# Patient Record
Sex: Male | Born: 1970 | Race: White | Hispanic: No | Smoking: Current every day smoker
Health system: Southern US, Community
[De-identification: ages and names within clinical notes are randomized; demographics above are authoritative.]

## PROBLEM LIST (undated history)

## (undated) DIAGNOSIS — F419 Anxiety disorder, unspecified: Secondary | ICD-10-CM

## (undated) HISTORY — DX: Anxiety disorder, unspecified: F41.9

---
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
## 2000-12-26 ENCOUNTER — Emergency Department (HOSPITAL_COMMUNITY): Admission: EM | Admit: 2000-12-26 | Discharge: 2000-12-26 | Payer: Self-pay | Admitting: Emergency Medicine

## 2000-12-26 ENCOUNTER — Encounter: Payer: Self-pay | Admitting: Emergency Medicine

## 2006-08-16 ENCOUNTER — Ambulatory Visit (HOSPITAL_COMMUNITY): Admission: RE | Admit: 2006-08-16 | Discharge: 2006-08-16 | Payer: Self-pay | Admitting: Internal Medicine

## 2013-01-02 ENCOUNTER — Other Ambulatory Visit: Payer: Self-pay | Admitting: Family Medicine

## 2013-01-02 NOTE — Telephone Encounter (Signed)
Ok one mo each not seen 7 mos needs ov with dr Lorin Picket before further

## 2013-02-09 ENCOUNTER — Telehealth: Payer: Self-pay | Admitting: *Deleted

## 2013-02-09 ENCOUNTER — Other Ambulatory Visit: Payer: Self-pay

## 2013-02-09 MED ORDER — SERTRALINE HCL 100 MG PO TABS: ORAL_TABLET | ORAL | Status: DC

## 2013-02-09 MED ORDER — ALPRAZOLAM 0.5 MG PO TABS: ORAL_TABLET | ORAL | Status: DC

## 2013-02-09 NOTE — Telephone Encounter (Signed)
May give 2 week on xanax and 1 refill on zoloft. He needs OV

## 2013-02-09 NOTE — Telephone Encounter (Signed)
Left message on voicemail notifying patient rx's sent into pharmacy and need to schedule OV.

## 2013-02-09 NOTE — Telephone Encounter (Signed)
Needs refill on alprazolam and sertaline today if possible. [facility] [place]. Last seen 05/26/2012.

## 2013-03-13 ENCOUNTER — Telehealth: Payer: Self-pay | Admitting: Family Medicine

## 2013-03-13 NOTE — Telephone Encounter (Signed)
Leaving Monday night  Patient is calling       [facility] in [place]

## 2013-03-13 NOTE — Telephone Encounter (Signed)
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 He says that due to his schedule he can't make appointments ahead of time and he wants to know if either he may be worked in on Monday or if more refills of his alprazolam and sertraline can be called in to [facility] in [place].

## 2013-03-13 NOTE — Telephone Encounter (Signed)
Transferred patient to front desk to schedule appointment for Monday morning per Dr. Lorin Picket.

## 2013-03-16 ENCOUNTER — Ambulatory Visit (INDEPENDENT_AMBULATORY_CARE_PROVIDER_SITE_OTHER): Payer: BC Managed Care – PPO | Admitting: Family Medicine

## 2013-03-16 ENCOUNTER — Encounter: Payer: Self-pay | Admitting: Family Medicine

## 2013-03-16 VITALS — BP 118/84 | Ht 71.0 in | Wt 269.2 lb

## 2013-03-16 DIAGNOSIS — E785 Hyperlipidemia, unspecified: Secondary | ICD-10-CM

## 2013-03-16 DIAGNOSIS — R358 Other polyuria: Secondary | ICD-10-CM

## 2013-03-16 DIAGNOSIS — F17200 Nicotine dependence, unspecified, uncomplicated: Secondary | ICD-10-CM

## 2013-03-16 DIAGNOSIS — F411 Generalized anxiety disorder: Secondary | ICD-10-CM

## 2013-03-16 DIAGNOSIS — R3589 Other polyuria: Secondary | ICD-10-CM

## 2013-03-16 DIAGNOSIS — Z720 Tobacco use: Secondary | ICD-10-CM | POA: Insufficient documentation

## 2013-03-16 DIAGNOSIS — F419 Anxiety disorder, unspecified: Secondary | ICD-10-CM | POA: Insufficient documentation

## 2013-03-16 MED ORDER — SERTRALINE HCL 100 MG PO TABS: ORAL_TABLET | ORAL | Status: DC

## 2013-03-16 MED ORDER — ALPRAZOLAM 0.5 MG PO TABS: ORAL_TABLET | ORAL | Status: DC

## 2013-03-16 NOTE — Patient Instructions (Signed)
DASH Diet  The DASH diet stands for "Dietary Approaches to Stop Hypertension." It is a healthy eating plan that has been shown to reduce high blood pressure (hypertension) in as little as 14 days, while also possibly providing other significant health benefits. These other health benefits include reducing the risk of breast cancer after menopause and reducing the risk of type 2 diabetes, heart disease, colon cancer, and stroke. Health benefits also include weight loss and slowing kidney failure in patients with chronic kidney disease.   DIET GUIDELINES  · Limit salt (sodium). Your diet should contain less than 1500 mg of sodium daily.  · Limit refined or processed carbohydrates. Your diet should include mostly whole grains. Desserts and added sugars should be used sparingly.  · Include small amounts of heart-healthy fats. These types of fats include nuts, oils, and tub margarine. Limit saturated and trans fats. These fats have been shown to be harmful in the body.  CHOOSING FOODS   The following food groups are based on a 2000 calorie diet. See your Registered Dietitian for individual calorie needs.  Grains and Grain Products (6 to 8 servings daily)  · Eat More Often: Whole-wheat bread, brown rice, whole-grain or wheat pasta, quinoa, popcorn without added fat or salt (air popped).  · Eat Less Often: White bread, white pasta, white rice, cornbread.  Vegetables (4 to 5 servings daily)  · Eat More Often: Fresh, frozen, and canned vegetables. Vegetables may be raw, steamed, roasted, or grilled with a minimal amount of fat.  · Eat Less Often/Avoid: Creamed or fried vegetables. Vegetables in a cheese sauce.  Fruit (4 to 5 servings daily)  · Eat More Often: All fresh, canned (in natural juice), or frozen fruits. Dried fruits without added sugar. One hundred percent fruit juice (½ cup [237 mL] daily).  · Eat Less Often: Dried fruits with added sugar. Canned fruit in light or heavy syrup.  Lean Meats, Fish, and Poultry (2  servings or less daily. One serving is 3 to 4 oz [85-114 g]).  · Eat More Often: Ninety percent or leaner ground beef, tenderloin, sirloin. Round cuts of beef, chicken breast, turkey breast. All fish. Grill, bake, or broil your meat. Nothing should be fried.  · Eat Less Often/Avoid: Fatty cuts of meat, turkey, or chicken leg, thigh, or wing. Fried cuts of meat or fish.  Dairy (2 to 3 servings)  · Eat More Often: Low-fat or fat-free milk, low-fat plain or light yogurt, reduced-fat or part-skim cheese.  · Eat Less Often/Avoid: Milk (whole, 2%). Whole milk yogurt. Full-fat cheeses.  Nuts, Seeds, and Legumes (4 to 5 servings per week)  · Eat More Often: All without added salt.  · Eat Less Often/Avoid: Salted nuts and seeds, canned beans with added salt.  Fats and Sweets (limited)  · Eat More Often: Vegetable oils, tub margarines without trans fats, sugar-free gelatin. Mayonnaise and salad dressings.  · Eat Less Often/Avoid: Coconut oils, palm oils, butter, stick margarine, cream, half and half, cookies, candy, pie.  FOR MORE INFORMATION  The Dash Diet Eating Plan: www.dashdiet.org  Document Released: 06/28/2011 Document Revised: 10/01/2011 Document Reviewed: 06/28/2011  ExitCare® Patient Information ©2014 [employer].

## 2013-03-16 NOTE — Progress Notes (Signed)
  Subjective:    Patient ID: Jordan Jones, male    DOB: July 08, 1971, 42 y.o.   MRN: [id number]  HPI Here for refills on meds, Zoloft and Xanax. Both are working for him. Patient uses Xanax sparingly he only uses it when he is not driving he denies abusing the medication.Marland Kitchen He does relate he does a very good job of taking his Zoloft and helps him greatly he denies any other particular problems no chest tightness pressure pain shortness of breath. He does smoke he knows he needs to quit smoking we also talked about healthy diet and regular exercise and try to bring his weight down. No other concerns.     Review of Systems See above.    Objective:   Physical Exam Lungs are clear hearts regular pulse normal extremities no edema skin warm dry neurologic grossly normal       Assessment & Plan:  Chronic anxiety issues-I. recommend that he continue onward with what he is doing Xanax sparingly.  This patient was counseled to quit smoking, maintain a healthy diet, and increase activity/exercise. Lab work was ordered. Followup again in approximately 6 months

## 2013-09-15 ENCOUNTER — Other Ambulatory Visit: Payer: Self-pay | Admitting: Family Medicine

## 2013-09-15 NOTE — Telephone Encounter (Signed)
May refill x2 needs office visit later this spring

## 2013-09-15 NOTE — Telephone Encounter (Signed)
Last seen 03/16/13

## 2013-11-28 ENCOUNTER — Other Ambulatory Visit: Payer: Self-pay | Admitting: Family Medicine

## 2013-11-30 NOTE — Telephone Encounter (Signed)
May have prescription for 14 tablets needs office visit

## 2013-11-30 NOTE — Telephone Encounter (Signed)
Last seen 03/11/13.

## 2014-03-28 ENCOUNTER — Other Ambulatory Visit: Payer: Self-pay | Admitting: Family Medicine

## 2014-04-29 ENCOUNTER — Other Ambulatory Visit: Payer: Self-pay | Admitting: *Deleted

## 2014-04-29 ENCOUNTER — Telehealth: Payer: Self-pay | Admitting: Family Medicine

## 2014-04-29 MED ORDER — SERTRALINE HCL 100 MG PO TABS: ORAL_TABLET | ORAL | Status: DC

## 2014-04-29 NOTE — Telephone Encounter (Signed)
Patient is requesting Rx for sertraline (ZOLOFT) 100 MG tablet. 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 He said he will make a follow up visit when he is available.     [facility]

## 2014-04-29 NOTE — Telephone Encounter (Signed)
Last seen over 1 year ago 02/2013

## 2014-04-29 NOTE — Telephone Encounter (Signed)
Med sent to pharm. Pt notified on voicemail that med was sent and that he needs ov before any further refills.

## 2014-04-29 NOTE — Telephone Encounter (Signed)
May have 30 with 1 refill, this gives him plenty of time to schedule a follow up with us

## 2014-07-02 ENCOUNTER — Ambulatory Visit (INDEPENDENT_AMBULATORY_CARE_PROVIDER_SITE_OTHER): Payer: BC Managed Care – PPO | Admitting: Nurse Practitioner

## 2014-07-02 ENCOUNTER — Encounter: Payer: Self-pay | Admitting: Nurse Practitioner

## 2014-07-02 VITALS — BP 132/88 | Ht 71.0 in | Wt 259.0 lb

## 2014-07-02 DIAGNOSIS — F419 Anxiety disorder, unspecified: Secondary | ICD-10-CM

## 2014-07-02 MED ORDER — ALPRAZOLAM 0.5 MG PO TABS: 0.5000 mg | ORAL_TABLET | Freq: Every evening | ORAL | Status: DC | PRN

## 2014-07-02 MED ORDER — SERTRALINE HCL 100 MG PO TABS: ORAL_TABLET | ORAL | Status: DC

## 2014-07-04 ENCOUNTER — Encounter: Payer: Self-pay | Admitting: Nurse Practitioner

## 2014-07-04 NOTE — Progress Notes (Signed)
Subjective:  Presents for recheck on anxiety. Doing well on Zoloft. Rare xanax for sleep. Has not had a physical or labs in a long time.   Objective:   BP 132/88 mmHg  Ht 5\' 11"  (1.803 m)  Wt 259 lb (117.482 kg)  BMI 36.14 kg/m2 NAD. Alert, oriented. Calm affect. Lungs clear. Heart RRR.  Assessment:  Problem List Items Addressed This Visit      Other   Chronic anxiety - Primary   Relevant Medications      ALPRAZolam (XANAX) tablet      sertraline (ZOLOFT) tablet      Plan:  Meds ordered this encounter  Medications  . ALPRAZolam (XANAX) 0.5 MG tablet    Sig: Take 1 tablet (0.5 mg total) by mouth at bedtime as needed.    Dispense:  14 tablet    Refill:  0    Order Specific Question:  Supervising Provider    Answer:  Merlyn AlbertLUKING, WILLIAM S [[id number]]  . sertraline (ZOLOFT) 100 MG tablet    Sig: TAKE 1 TABLET BY MOUTH EVERY DAY    Dispense:  90 tablet    Refill:  3    Order Specific Question:  Supervising Provider    Answer:  Riccardo DubinLUKING, WILLIAM S [[id number]]   Strongly recommend PE and screening labs. Patient will consider this and call back. Return in about 1 year (around 07/03/2015).

## 2015-07-21 ENCOUNTER — Other Ambulatory Visit: Payer: Self-pay | Admitting: Nurse Practitioner

## 2015-07-22 NOTE — Telephone Encounter (Signed)
This patient needs office visit, may give 2 weeks of medicine

## 2015-08-05 ENCOUNTER — Telehealth: Payer: Self-pay | Admitting: Family Medicine

## 2015-08-05 MED ORDER — SERTRALINE HCL 100 MG PO TABS: 100.0000 mg | ORAL_TABLET | Freq: Every day | ORAL | Status: DC

## 2015-08-05 NOTE — Telephone Encounter (Signed)
Give 2 weeease

## 2015-08-05 NOTE — Telephone Encounter (Signed)
Called patient and informed him per Dr.Scott Luking- 2 weeks worth of medication was sent into pharmacy. Patient verbalized understanding.

## 2015-08-05 NOTE — Telephone Encounter (Signed)
Patient would like refill on zoloft 100 mg just enough until he is seen on 1/23 for medcheck.

## 2015-08-15 ENCOUNTER — Encounter: Payer: Self-pay | Admitting: Family Medicine

## 2015-08-15 ENCOUNTER — Ambulatory Visit (INDEPENDENT_AMBULATORY_CARE_PROVIDER_SITE_OTHER): Payer: BLUE CROSS/BLUE SHIELD | Admitting: Family Medicine

## 2015-08-15 VITALS — BP 136/86 | Ht 71.0 in | Wt 249.8 lb

## 2015-08-15 DIAGNOSIS — Z131 Encounter for screening for diabetes mellitus: Secondary | ICD-10-CM

## 2015-08-15 DIAGNOSIS — F419 Anxiety disorder, unspecified: Secondary | ICD-10-CM | POA: Diagnosis not present

## 2015-08-15 DIAGNOSIS — E785 Hyperlipidemia, unspecified: Secondary | ICD-10-CM

## 2015-08-15 DIAGNOSIS — Z720 Tobacco use: Secondary | ICD-10-CM | POA: Diagnosis not present

## 2015-08-15 DIAGNOSIS — E669 Obesity, unspecified: Secondary | ICD-10-CM

## 2015-08-15 MED ORDER — SERTRALINE HCL 100 MG PO TABS: 100.0000 mg | ORAL_TABLET | Freq: Every day | ORAL | Status: DC

## 2015-08-15 NOTE — Progress Notes (Signed)
   Subjective:    Patient ID: Jordan Jones, male    DOB: [birth year], 45 y.o.   MRN: [id number]  HPI Patient arrives for a follow up on anxiety. Patient doing well on Zoloft with no problems or concerns. Patient does state he smokes a 1 pack a day slightly more denies chest tightness pressure pain difficulty breathing swelling in the legs denies being depressed. Staying Zoloft as well for anxiousness. Does not take Xanax currently. Tries to eat healthy unable to do exercise because of his work.  Review of Systems See above    Objective:   Physical Exam  Mild obesity lungs clear heart regular pulse normal extremities no edema      Assessment & Plan:  Chronic generalized anxiety. Not depressed. Zoloft does well for him. Continue current measures   smoking-patient was counseled and he was counseled move forward to quitting. Follow-up if ongoing troubles  Hyperlipidemia check lipid profile also needs metabolic 7 these were ordered he will get this in the next month

## 2015-08-15 NOTE — Patient Instructions (Signed)

## 2016-05-05 ENCOUNTER — Other Ambulatory Visit: Payer: Self-pay | Admitting: Family Medicine

## 2016-05-07 NOTE — Telephone Encounter (Signed)
May have this and 2 refills needs office visit 

## 2017-01-25 ENCOUNTER — Other Ambulatory Visit: Payer: Self-pay | Admitting: Family Medicine

## 2017-01-28 NOTE — Telephone Encounter (Signed)
Last visit 1-1/2 years ago, needs office visit refuse medication

## 2017-01-28 NOTE — Telephone Encounter (Signed)
Last seen 08/18/2016

## 2017-01-28 NOTE — Telephone Encounter (Signed)
scotts pt 

## 2018-11-12 ENCOUNTER — Other Ambulatory Visit: Payer: Self-pay

## 2018-11-12 ENCOUNTER — Other Ambulatory Visit: Payer: Self-pay | Admitting: Family Medicine

## 2018-11-12 ENCOUNTER — Ambulatory Visit (INDEPENDENT_AMBULATORY_CARE_PROVIDER_SITE_OTHER): Payer: BLUE CROSS/BLUE SHIELD | Admitting: Family Medicine

## 2018-11-12 DIAGNOSIS — I889 Nonspecific lymphadenitis, unspecified: Secondary | ICD-10-CM

## 2018-11-12 DIAGNOSIS — B349 Viral infection, unspecified: Secondary | ICD-10-CM | POA: Diagnosis not present

## 2018-11-12 MED ORDER — DOXYCYCLINE HYCLATE 100 MG PO TABS: 100.0000 mg | ORAL_TABLET | Freq: Two times a day (BID) | ORAL | 0 refills | Status: DC

## 2018-11-12 NOTE — Progress Notes (Signed)
   Subjective:    Patient ID: Jordan Jones, male    DOB: [birth year], 48 y.o.   MRN: [id number] audio plus visual Sinus Problem  This is a new problem. The current episode started yesterday. Associated symptoms include congestion and a sore throat. (Lymph node sore and swollen Muscle aches Fatigue) Past treatments include nothing.   Patient needs doctor note to return to work   Review of Systems  HENT: Positive for congestion and sore throat.    Virtual Visit via Video Note  I connected with Jordan Jones on 11/12/18 at  3:30 PM EDT by a video enabled telemedicine application and verified that I am speaking with the correct person using two identifiers.   I discussed the limitations of evaluation and management by telemedicine and the availability of in person appointments. The patient expressed understanding and agreed to proceed.  History of Present Illness:    Observations/Objective:   Assessment and Plan:   Follow Up Instructions:    I discussed the assessment and treatment plan with the patient. The patient was provided an opportunity to ask questions and all were answered. The patient agreed with the plan and demonstrated an understanding of the instructions.   The patient was advised to call back or seek an in-person evaluation if the symptoms worsen or if the condition fails to improve as anticipated.  I provided 15 minutes of non-face-to-face time during this encounter.  On Monday patient developed some sore throat.  Also mild headache.  Some nasal congestion.  Also muscle aches.  Next day noted some low-grade fever.  Energy level not 100% no nausea no vomiting no diarrhea decent appetite  Also notes very tender left anterior cervical lymph node     Objective:   Physical Exam   Virtual visit     Assessment & Plan:  Impression viral syndrome.  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  Homero Fellers  discussion held.  Will cover with antibiotics for cervical lymphadenitis, however patient has potential to have early coronavirus infection.  Discussed.  Warning signs discussed.  Will write work excuse.  Rationale discussed.

## 2018-11-14 NOTE — Patient Instructions (Addendum)
                 11/13/2018 To whom it concerns,  I am dictating this letter in regards to Jordan Jones, date of birth Feb 25, 1971, a longstanding patient of our practice.  Patient has had a viral syndrome developed as a Monday, April 20.  Since widespread testing is not available, we have to assume the possibility that this could be a coronavirus infection, though it likely is not.  According to [facility] recommendations, the following must occurred before the patient can return to work.  1.  The patient asked to wait 7 days until potential return to work April 27.  2.  The patient has to be fever free for the 3 days prior to the 27th, without taking any medicine they can lower her temperature like Tylenol or Motrin.  3.  Patient has to report substantial improvement in symptoms.  If patient fits all this criteria may return to work as of April 27.  Sincerely, Lubertha South MD

## 2020-02-22 DIAGNOSIS — Z713 Dietary counseling and surveillance: Secondary | ICD-10-CM | POA: Diagnosis not present

## 2020-06-11 ENCOUNTER — Other Ambulatory Visit: Payer: Self-pay

## 2020-06-11 ENCOUNTER — Ambulatory Visit
Admission: EM | Admit: 2020-06-11 | Discharge: 2020-06-11 | Disposition: A | Discharge status: Home / Self Care | Payer: BC Managed Care – PPO | Attending: Emergency Medicine | Admitting: Emergency Medicine

## 2020-06-11 ENCOUNTER — Encounter: Payer: Self-pay | Admitting: Emergency Medicine

## 2020-06-11 DIAGNOSIS — S61411A Laceration without foreign body of right hand, initial encounter: Secondary | ICD-10-CM | POA: Diagnosis not present

## 2020-06-11 DIAGNOSIS — Z23 Encounter for immunization: Secondary | ICD-10-CM | POA: Diagnosis not present

## 2020-06-11 MED ORDER — TETANUS-DIPHTH-ACELL PERTUSSIS 5-2.5-18.5 LF-MCG/0.5 IM SUSY
0.5000 mL | PREFILLED_SYRINGE | Freq: Once | INTRAMUSCULAR | Status: AC
  Administered 2020-06-11: 0.5 mL via INTRAMUSCULAR

## 2020-06-11 MED ORDER — CEPHALEXIN 500 MG PO CAPS: 500.0000 mg | ORAL_CAPSULE | Freq: Four times a day (QID) | ORAL | 0 refills | Status: DC

## 2020-06-11 NOTE — ED Provider Notes (Addendum)
[facility]   [id number] 06/11/20 Arrival Time: 1438  CC: LACERATION  SUBJECTIVE:  Jordan Jones is a 49 y.o. male Who presented to the urgent care with a complaint of laceration to right palm that occurred today.  Developed symptom from a laceration from a knife.  Currently not on blood thinners.  Denies any symptom in the past.  Denies chills, fever, nausea, vomiting, redness, swelling, purulent drainage, decreased strength sensation.  Td UTD: No.  ROS: As per HPI.  All other pertinent ROS negative.     Past Medical History:  Diagnosis Date  . Anxiety    Past Surgical History:  Procedure Laterality Date  . FINGER SURGERY  1990   severed finger left pinky    No Known Allergies No current facility-administered medications on file prior to encounter.   Current Outpatient Medications on File Prior to Encounter  Medication Sig Dispense Refill  . ALPRAZolam (XANAX) 0.5 MG tablet Take 1 tablet (0.5 mg total) by mouth at bedtime as needed. 14 tablet 0  . doxycycline (VIBRA-TABS) 100 MG tablet Take 1 tablet (100 mg total) by mouth 2 (two) times daily. With tall glass of water 20 tablet 0  . sertraline (ZOLOFT) 100 MG tablet TAKE 1 TABLET BY MOUTH DAILY 30 tablet 2   Social History   Socioeconomic History  . Marital status: Single    Spouse name: Not on file  . Number of children: Not on file  . Years of education: Not on file  . Highest education level: Not on file  Occupational History  . Not on file  Tobacco Use  . Smoking status: Current Every Day Smoker    Packs/day: 1.00    Types: Cigarettes  . Smokeless tobacco: Never Used  Substance and Sexual Activity  . Alcohol use: Never  . Drug use: Never  . Sexual activity: Not on file  Other Topics Concern  . Not on file  Social History Narrative  . Not on file   Social Determinants of Health   Financial Resource Strain:   . Difficulty of Paying Living Expenses: Not on file  Food Insecurity:   .  Worried About Programme researcher, broadcasting/film/video in the Last Year: Not on file  . Ran Out of Food in the Last Year: Not on file  Transportation Needs:   . Lack of Transportation (Medical): Not on file  . Lack of Transportation (Non-Medical): Not on file  Physical Activity:   . Days of Exercise per Week: Not on file  . Minutes of Exercise per Session: Not on file  Stress:   . Feeling of Stress : Not on file  Social Connections:   . Frequency of Communication with Friends and Family: Not on file  . Frequency of Social Gatherings with Friends and Family: Not on file  . Attends Religious Services: Not on file  . Active Member of Clubs or Organizations: Not on file  . Attends Banker Meetings: Not on file  . Marital Status: Not on file  Intimate Partner Violence:   . Fear of Current or Ex-Partner: Not on file  . Emotionally Abused: Not on file  . Physically Abused: Not on file  . Sexually Abused: Not on file   Family History  Problem Relation Age of Onset  . Heart attack Other      OBJECTIVE:  Vitals:   06/11/20 1545 06/11/20 1546  BP: 129/89   Pulse: 96   Resp: 17   Temp: 98.6 F (  37 C)   TempSrc: Oral   SpO2: 96%   Weight:  250 lb (113.4 kg)  Height:  5\' 11"  (1.803 m)     General appearance: alert; no distress Chest: CTA, heart sounds normal Heart: RRR, no rub, gallop or murmur Skin: laceration of right palm; size: approx 2 cm Psychological: alert and cooperative; normal mood and affect   No results found for this or any previous visit.  Labs Reviewed - No data to display  No results found.  Procedure: Verbal consent obtained. Patient provided with risks and alternatives to the procedure. Wound copiously irrigated with NS then cleansed with betadine. Anesthetized with 2 mL of lidocaine with epinephrine after LET. Wound carefully explored. No foreign body, tendon injury, or nonviable tissue were noted. Using sterile technique 2 interrupted 5-0 Ethilon Prolene  sutures were placed to reapproximate the wound. Patient tolerated procedure well. No complications. Minimal bleeding. Patient advised to look for and return for any signs of infection such as redness, swelling, discharge, or worsening pain. Return for suture removal in 7 days.  ASSESSMENT & PLAN:  1. Laceration of right hand without foreign body, initial encounter     Meds ordered this encounter  Medications  . Tdap (BOOSTRIX) injection 0.5 mL  . cephALEXin (KEFLEX) 500 MG capsule    Sig: Take 1 capsule (500 mg total) by mouth 4 (four) times daily.    Dispense:  20 capsule    Refill:  0   Discharge instructions Bandage applied Keep covered for next and dry for next 24-48 hours.  After then you may gently clean with warm water and mild soap.  Avoid submerging wound in water. Change dressing daily and apply a thin layer of neosporin.  Return in 7-10 days to have sutures removed.   Take OTC ibuprofen or tylenol as needed for pain releif Return sooner or go to the ED if you have any new or worsening symptoms such as increased pain, redness, swelling, drainage, discharge, decreased range of motion of extremity, etc..     Reviewed expectations re: course of current medical issues. Questions answered. Outlined signs and symptoms indicating need for more acute intervention. Patient verbalized understanding. After Visit Summary given.   9-10, FNP 06/11/20 1630    06/13/20, FNP 06/11/20 1630

## 2020-06-11 NOTE — ED Triage Notes (Signed)
Laceration to RT palm from knife that happened today. No active bleeding. Does not recall last tetanus vaccine.

## 2020-06-11 NOTE — Discharge Instructions (Addendum)
Bandage applied Keep covered for next and dry for next 24-48 hours.  After then you may gently clean with warm water and mild soap.  Avoid submerging wound in water. Change dressing daily and apply a thin layer of neosporin.  Return in 7-10 days to have sutures removed.   Take OTC ibuprofen or tylenol as needed for pain releif Return sooner or go to the ED if you have any new or worsening symptoms such as increased pain, redness, swelling, drainage, discharge, decreased range of motion of extremity, etc..   

## 2020-06-14 DIAGNOSIS — Z713 Dietary counseling and surveillance: Secondary | ICD-10-CM | POA: Diagnosis not present

## 2020-09-26 DIAGNOSIS — Z713 Dietary counseling and surveillance: Secondary | ICD-10-CM | POA: Diagnosis not present

## 2021-02-28 ENCOUNTER — Ambulatory Visit
Admission: RE | Admit: 2021-02-28 | Discharge: 2021-02-28 | Disposition: A | Discharge status: Home / Self Care | Payer: Worker's Compensation | Source: Ambulatory Visit | Attending: Physician Assistant | Admitting: Physician Assistant

## 2021-02-28 ENCOUNTER — Ambulatory Visit
Admission: RE | Admit: 2021-02-28 | Discharge: 2021-02-28 | Disposition: A | Discharge status: Home / Self Care | Payer: Worker's Compensation | Attending: Physician Assistant | Admitting: Physician Assistant

## 2021-02-28 ENCOUNTER — Other Ambulatory Visit: Payer: Self-pay | Admitting: Physician Assistant

## 2021-02-28 DIAGNOSIS — R52 Pain, unspecified: Secondary | ICD-10-CM
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
## 2023-01-05 ENCOUNTER — Other Ambulatory Visit: Payer: Self-pay

## 2023-01-05 ENCOUNTER — Ambulatory Visit
Admission: EM | Admit: 2023-01-05 | Discharge: 2023-01-05 | Disposition: A | Discharge status: Home / Self Care | Payer: No Typology Code available for payment source | Attending: Family Medicine | Admitting: Family Medicine

## 2023-01-05 ENCOUNTER — Encounter: Payer: Self-pay | Admitting: Emergency Medicine

## 2023-01-05 DIAGNOSIS — A630 Anogenital (venereal) warts: Secondary | ICD-10-CM

## 2023-01-05 NOTE — ED Triage Notes (Addendum)
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 Pt also reports intermittent "bloody mucous-like" drainage from rectum.   Pt reports reached out to pcp this week but reports was dropped as a patient.

## 2023-01-07 NOTE — ED Provider Notes (Signed)
  [facility]   [id number] 01/05/23 Arrival Time: 0825  ASSESSMENT & PLAN:  1. Condyloma acuminatum of anus    Recommend surgical evaluation. Referral placed. OTC analgesics if necessary.   Follow-up Information     Call  [facility].   Specialty: General Surgery Contact information: [address] [place] [address] [phone]               Reviewed expectations re: course of current medical issues. Questions answered. Outlined signs and symptoms indicating need for more acute intervention. Patient verbalized understanding. After Visit Summary given.   SUBJECTIVE: History from: patient. Jordan Jones is a 52 y.o. 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 Reports a recent flare-up "of internal and external bumps" on rectum;  past few months. Pt also reports intermittent "bloody mucous-like" drainage from rectum.   Pt reports reached out to pcp this week but reports was dropped as a patient. Past Surgical History:  Procedure Laterality Date   FINGER SURGERY  1990   severed finger left pinky      OBJECTIVE:  Vitals:   01/05/23 0905  BP: (!) 137/90  Pulse: 61  Resp: 20  Temp: 98.1 F (36.7 C)  TempSrc: Oral  SpO2: 97%    Rectal: extensive burden of anal warts around rectum extending 1-2 cm from rectum; no bleeding; no firm masses Psychological: alert and cooperative; normal mood and affect  No Known Allergies                                             Past Medical History:  Diagnosis Date   Anxiety     Social History   Socioeconomic History   Marital status: Single    Spouse name: Not on file   Number of children: Not on file   Years of education: Not on file   Highest education level: Not on file  Occupational History   Not on file  Tobacco Use   Smoking status: Every Day    Packs/day: 1    Types:  Cigarettes   Smokeless tobacco: Never  Substance and Sexual Activity   Alcohol use: Never   Drug use: Never   Sexual activity: Not on file  Other Topics Concern   Not on file  Social History Narrative   Not on file   Social Determinants of Health   Financial Resource Strain: Not on file  Food Insecurity: Not on file  Transportation Needs: Not on file  Physical Activity: Not on file  Stress: Not on file  Social Connections: Not on file  Intimate Partner Violence: Not on file    Family History  Problem Relation Age of Onset   Heart attack Other      Mardella Layman, MD 01/07/23 0900

## 2023-01-22 ENCOUNTER — Other Ambulatory Visit: Payer: Self-pay

## 2023-01-22 ENCOUNTER — Encounter: Payer: Self-pay | Admitting: General Surgery

## 2023-01-22 ENCOUNTER — Ambulatory Visit (INDEPENDENT_AMBULATORY_CARE_PROVIDER_SITE_OTHER): Payer: No Typology Code available for payment source | Admitting: General Surgery

## 2023-01-22 VITALS — BP 138/83 | HR 58 | Temp 97.8°F | Resp 16 | Ht 71.0 in | Wt 186.0 lb

## 2023-01-22 DIAGNOSIS — A630 Anogenital (venereal) warts: Secondary | ICD-10-CM

## 2023-01-22 NOTE — H&P (Signed)
Jordan Jones; [id number]; May 04, 1971   HPI Patient is a 52 year old white male who was referred to my care by the emergency room for evaluation and treatment of recurrent condyloma.  Patient has had previous condyloma removed from his penis but now has extensive condyloma of the anus.  He is having a difficult time keeping himself clean. Past Medical History:  Diagnosis Date   Anxiety     Past Surgical History:  Procedure Laterality Date   FINGER SURGERY  1990   severed finger left pinky     Family History  Problem Relation Age of Onset   Heart attack Other     No current outpatient medications on file prior to visit.   No current facility-administered medications on file prior to visit.    No Known Allergies  Social History   Substance and Sexual Activity  Alcohol Use Never    Social History   Tobacco Use  Smoking Status Every Day   Packs/day: 1.5   Types: Cigarettes  Smokeless Tobacco Never    Review of Systems  Constitutional: Negative.   HENT: Negative.    Eyes: Negative.   Respiratory: Negative.    Cardiovascular: Negative.   Gastrointestinal:  Positive for abdominal pain.  Genitourinary:  Positive for frequency.  Musculoskeletal:  Positive for joint pain.  Skin: Negative.   Neurological: Negative.   Endo/Heme/Allergies: Negative.   Psychiatric/Behavioral: Negative.      Objective   Vitals:   01/22/23 1009  BP: 138/83  Pulse: (!) 58  Resp: 16  Temp: 97.8 F (36.6 C)  SpO2: 98%    Physical Exam Vitals reviewed.  Constitutional:      Appearance: Normal appearance. He is normal weight. He is not ill-appearing.  HENT:     Head: Normocephalic and atraumatic.  Cardiovascular:     Rate and Rhythm: Normal rate and regular rhythm.     Heart sounds: Normal heart sounds. No murmur heard.    No friction rub. No gallop.  Pulmonary:     Effort: Pulmonary effort is normal. No respiratory distress.     Breath sounds: Normal breath sounds. No  stridor. No wheezing, rhonchi or rales.  Genitourinary:    Comments: Extensive condyloma noted around the anus.  Examination of the anal canal is limited secondary to the extensiveness of the condyloma. Skin:    General: Skin is warm and dry.  Neurological:     Mental Status: He is alert and oriented to person, place, and time.     Assessment  Condyloma acuminatum of anus, extensive Plan  Patient is scheduled for excision of the condyloma of the anus on 02/06/2023.  The risks and benefits of the procedure including bleeding, infection, pain, the possibility of recurrence of the condyloma were fully explained to the patient, who gave informed consent.  I told him that I would probably not be able to fully excise all the condyloma at this sitting.  He understands.

## 2023-01-22 NOTE — Progress Notes (Signed)
Jordan Jones; [id number]; 09/14/1970   HPI Patient is a 51-year-old white male who was referred to my care by the emergency room for evaluation and treatment of recurrent condyloma.  Patient has had previous condyloma removed from his penis but now has extensive condyloma of the anus.  He is having a difficult time keeping himself clean. Past Medical History:  Diagnosis Date   Anxiety     Past Surgical History:  Procedure Laterality Date   FINGER SURGERY  1990   severed finger left pinky     Family History  Problem Relation Age of Onset   Heart attack Other     No current outpatient medications on file prior to visit.   No current facility-administered medications on file prior to visit.    No Known Allergies  Social History   Substance and Sexual Activity  Alcohol Use Never    Social History   Tobacco Use  Smoking Status Every Day   Packs/day: 1.5   Types: Cigarettes  Smokeless Tobacco Never    Review of Systems  Constitutional: Negative.   HENT: Negative.    Eyes: Negative.   Respiratory: Negative.    Cardiovascular: Negative.   Gastrointestinal:  Positive for abdominal pain.  Genitourinary:  Positive for frequency.  Musculoskeletal:  Positive for joint pain.  Skin: Negative.   Neurological: Negative.   Endo/Heme/Allergies: Negative.   Psychiatric/Behavioral: Negative.      Objective   Vitals:   01/22/23 1009  BP: 138/83  Pulse: (!) 58  Resp: 16  Temp: 97.8 F (36.6 C)  SpO2: 98%    Physical Exam Vitals reviewed.  Constitutional:      Appearance: Normal appearance. He is normal weight. He is not ill-appearing.  HENT:     Head: Normocephalic and atraumatic.  Cardiovascular:     Rate and Rhythm: Normal rate and regular rhythm.     Heart sounds: Normal heart sounds. No murmur heard.    No friction rub. No gallop.  Pulmonary:     Effort: Pulmonary effort is normal. No respiratory distress.     Breath sounds: Normal breath sounds. No  stridor. No wheezing, rhonchi or rales.  Genitourinary:    Comments: Extensive condyloma noted around the anus.  Examination of the anal canal is limited secondary to the extensiveness of the condyloma. Skin:    General: Skin is warm and dry.  Neurological:     Mental Status: He is alert and oriented to person, place, and time.     Assessment  Condyloma acuminatum of anus, extensive Plan  Patient is scheduled for excision of the condyloma of the anus on 02/06/2023.  The risks and benefits of the procedure including bleeding, infection, pain, the possibility of recurrence of the condyloma were fully explained to the patient, who gave informed consent.  I told him that I would probably not be able to fully excise all the condyloma at this sitting.  He understands. 

## 2023-02-01 NOTE — Patient Instructions (Signed)
Jordan Jones  02/01/2023     @PREFPERIOPPHARMACY @   Your procedure is scheduled on  02/06/2023.   Report to [facility] at  [phone]  A.M.   Call this number if you have problems the morning of surgery:  [phone]  If you experience any cold or flu symptoms such as cough, fever, chills, shortness of breath, etc. between now and your scheduled surgery, please notify us at the above number.   Remember:  Do not eat or drink after midnight.      Take these medicines the morning of surgery with A SIP OF WATER                                                None.    Do not wear jewelry, make-up or nail polish, including gel polish,  artificial nails, or any other type of covering on natural nails (fingers and  toes).  Do not wear lotions, powders, or perfumes, or deodorant.  Do not shave 48 hours prior to surgery.  Men may shave face and neck.  Do not bring valuables to the hospital.  [facility] is not responsible for any belongings or valuables.  Contacts, dentures or bridgework may not be worn into surgery.  Leave your suitcase in the car.  After surgery it may be brought to your room.  For patients admitted to the hospital, discharge time will be determined by your treatment team.  Patients discharged the day of surgery will not be allowed to drive home and must have someone with them for 24 hours.    Special instructions:   DO NOT smoke tobacco or vape for 24 hours before your procedure.  Please read over the following fact sheets that you were given. Coughing and Deep Breathing, Anesthesia Post-op Instructions, and Care and Recovery After Surgery      General Anesthesia, Adult, Care After The following information offers guidance on how to care for yourself after your procedure. Your health care provider may also give you more specific instructions. If you have problems or questions, contact your health care provider. What can I expect after the procedure? After  the procedure, it is common for people to: Have pain or discomfort at the IV site. Have nausea or vomiting. Have a sore throat or hoarseness. Have trouble concentrating. Feel cold or chills. Feel weak, sleepy, or tired (fatigue). Have soreness and body aches. These can affect parts of the body that were not involved in surgery. Follow these instructions at home: For the time period you were told by your health care provider:  Rest. Do not participate in activities where you could fall or become injured. Do not drive or use machinery. Do not drink alcohol. Do not take sleeping pills or medicines that cause drowsiness. Do not make important decisions or sign legal documents. Do not take care of children on your own. General instructions Drink enough fluid to keep your urine pale yellow. If you have sleep apnea, surgery and certain medicines can increase your risk for breathing problems. Follow instructions from your health care provider about wearing your sleep device: Anytime you are sleeping, including during daytime naps. While taking prescription pain medicines, sleeping medicines, or medicines that make you drowsy. Return to your normal activities as told by your health care provider. Ask your health care provider  what activities are safe for you. Take over-the-counter and prescription medicines only as told by your health care provider. Do not use any products that contain nicotine or tobacco. These products include cigarettes, chewing tobacco, and vaping devices, such as e-cigarettes. These can delay incision healing after surgery. If you need help quitting, ask your health care provider. Contact a health care provider if: You have nausea or vomiting that does not get better with medicine. You vomit every time you eat or drink. You have pain that does not get better with medicine. You cannot urinate or have bloody urine. You develop a skin rash. You have a fever. Get help right  away if: You have trouble breathing. You have chest pain. You vomit blood. These symptoms may be an emergency. Get help right away. Call 911. Do not wait to see if the symptoms will go away. Do not drive yourself to the hospital. Summary After the procedure, it is common to have a sore throat, hoarseness, nausea, vomiting, or to feel weak, sleepy, or fatigue. For the time period you were told by your health care provider, do not drive or use machinery. Get help right away if you have difficulty breathing, have chest pain, or vomit blood. These symptoms may be an emergency. This information is not intended to replace advice given to you by your health care provider. Make sure you discuss any questions you have with your health care provider. Document Revised: 10/06/2021 Document Reviewed: 10/06/2021 Elsevier Patient Education  2024 [employer].

## 2023-02-04 ENCOUNTER — Encounter (HOSPITAL_COMMUNITY)
Admission: RE | Admit: 2023-02-04 | Discharge: 2023-02-04 | Disposition: A | Discharge status: Home / Self Care | Payer: No Typology Code available for payment source | Source: Ambulatory Visit | Attending: General Surgery | Admitting: General Surgery

## 2023-02-04 ENCOUNTER — Encounter (HOSPITAL_COMMUNITY): Payer: Self-pay

## 2023-02-06 ENCOUNTER — Ambulatory Visit (HOSPITAL_COMMUNITY): Payer: No Typology Code available for payment source | Admitting: Certified Registered"

## 2023-02-06 ENCOUNTER — Encounter (HOSPITAL_COMMUNITY)
Admission: RE | Disposition: A | Discharge status: Home / Self Care | Payer: Self-pay | Source: Home / Self Care | Attending: General Surgery

## 2023-02-06 ENCOUNTER — Ambulatory Visit (HOSPITAL_COMMUNITY)
Admission: RE | Admit: 2023-02-06 | Discharge: 2023-02-06 | Disposition: A | Discharge status: Home / Self Care | Payer: No Typology Code available for payment source | Attending: General Surgery | Admitting: General Surgery

## 2023-02-06 ENCOUNTER — Other Ambulatory Visit: Payer: Self-pay

## 2023-02-06 ENCOUNTER — Encounter (HOSPITAL_COMMUNITY): Payer: Self-pay | Admitting: General Surgery

## 2023-02-06 ENCOUNTER — Ambulatory Visit (HOSPITAL_BASED_OUTPATIENT_CLINIC_OR_DEPARTMENT_OTHER): Payer: No Typology Code available for payment source | Admitting: Certified Registered"

## 2023-02-06 DIAGNOSIS — A630 Anogenital (venereal) warts: Secondary | ICD-10-CM

## 2023-02-06 DIAGNOSIS — K629 Disease of anus and rectum, unspecified: Secondary | ICD-10-CM

## 2023-02-06 DIAGNOSIS — F17210 Nicotine dependence, cigarettes, uncomplicated: Secondary | ICD-10-CM | POA: Insufficient documentation

## 2023-02-06 HISTORY — PX: MASS EXCISION: SHX2000

## 2023-02-06 SURGERY — EXCISION MASS
Anesthesia: General | Site: Anus

## 2023-02-06 MED ORDER — OXYCODONE HCL 5 MG PO TABS: 5.0000 mg | ORAL_TABLET | ORAL | 0 refills | Status: DC | PRN

## 2023-02-06 MED ORDER — ONDANSETRON HCL 4 MG/2ML IJ SOLN
INTRAMUSCULAR | Status: AC
  Filled 2023-02-06: qty 2

## 2023-02-06 MED ORDER — PROPOFOL 10 MG/ML IV BOLUS
INTRAVENOUS | Status: AC
  Filled 2023-02-06: qty 20

## 2023-02-06 MED ORDER — CHLORHEXIDINE GLUCONATE CLOTH 2 % EX PADS: 6.0000 | MEDICATED_PAD | Freq: Once | CUTANEOUS | Status: DC

## 2023-02-06 MED ORDER — SILVER SULFADIAZINE 1 % EX CREA: 1.0000 | TOPICAL_CREAM | Freq: Two times a day (BID) | CUTANEOUS | 0 refills | Status: DC

## 2023-02-06 MED ORDER — GLYCOPYRROLATE PF 0.2 MG/ML IJ SOSY
PREFILLED_SYRINGE | INTRAMUSCULAR | Status: DC | PRN
  Administered 2023-02-06: .2 mg via INTRAVENOUS

## 2023-02-06 MED ORDER — LIDOCAINE VISCOUS HCL 2 % MT SOLN
OROMUCOSAL | Status: DC | PRN
  Administered 2023-02-06: 1

## 2023-02-06 MED ORDER — CHLORHEXIDINE GLUCONATE 0.12 % MT SOLN
15.0000 mL | Freq: Once | OROMUCOSAL | Status: AC
  Administered 2023-02-06: 15 mL via OROMUCOSAL

## 2023-02-06 MED ORDER — MIDAZOLAM HCL 2 MG/2ML IJ SOLN
INTRAMUSCULAR | Status: AC
  Filled 2023-02-06: qty 2

## 2023-02-06 MED ORDER — LACTATED RINGERS IV SOLN: INTRAVENOUS | Status: DC

## 2023-02-06 MED ORDER — KETAMINE HCL 50 MG/5ML IJ SOSY
PREFILLED_SYRINGE | INTRAMUSCULAR | Status: AC
  Filled 2023-02-06: qty 5

## 2023-02-06 MED ORDER — FENTANYL CITRATE (PF) 100 MCG/2ML IJ SOLN
INTRAMUSCULAR | Status: AC
  Filled 2023-02-06: qty 2

## 2023-02-06 MED ORDER — DEXMEDETOMIDINE HCL IN NACL 80 MCG/20ML IV SOLN
INTRAVENOUS | Status: DC | PRN
  Administered 2023-02-06 (×2): 8 ug via INTRAVENOUS

## 2023-02-06 MED ORDER — HYDROMORPHONE HCL 1 MG/ML IJ SOLN
0.2500 mg | INTRAMUSCULAR | Status: DC | PRN
  Administered 2023-02-06: 0.5 mg via INTRAVENOUS
  Filled 2023-02-06: qty 0.5

## 2023-02-06 MED ORDER — BUPIVACAINE HCL (PF) 0.5 % IJ SOLN
INTRAMUSCULAR | Status: AC
  Filled 2023-02-06: qty 30

## 2023-02-06 MED ORDER — ORAL CARE MOUTH RINSE: 15.0000 mL | Freq: Once | OROMUCOSAL | Status: AC

## 2023-02-06 MED ORDER — SODIUM CHLORIDE 0.9 % IV SOLN
INTRAVENOUS | Status: AC
  Filled 2023-02-06: qty 2

## 2023-02-06 MED ORDER — MEPERIDINE HCL 50 MG/ML IJ SOLN: 6.2500 mg | INTRAMUSCULAR | Status: DC | PRN

## 2023-02-06 MED ORDER — LIDOCAINE 2% (20 MG/ML) 5 ML SYRINGE
INTRAMUSCULAR | Status: DC | PRN
  Administered 2023-02-06: 100 mg via INTRAVENOUS

## 2023-02-06 MED ORDER — DEXMEDETOMIDINE HCL IN NACL 80 MCG/20ML IV SOLN
INTRAVENOUS | Status: AC
  Filled 2023-02-06: qty 20

## 2023-02-06 MED ORDER — LIDOCAINE VISCOUS HCL 2 % MT SOLN
OROMUCOSAL | Status: AC
  Filled 2023-02-06: qty 15

## 2023-02-06 MED ORDER — DEXAMETHASONE SODIUM PHOSPHATE 4 MG/ML IJ SOLN
INTRAMUSCULAR | Status: DC | PRN
  Administered 2023-02-06: 5 mg via INTRAVENOUS

## 2023-02-06 MED ORDER — PROPOFOL 10 MG/ML IV BOLUS
INTRAVENOUS | Status: DC | PRN
  Administered 2023-02-06: 200 mg via INTRAVENOUS

## 2023-02-06 MED ORDER — MIDAZOLAM HCL 5 MG/5ML IJ SOLN
INTRAMUSCULAR | Status: DC | PRN
  Administered 2023-02-06: 2 mg via INTRAVENOUS

## 2023-02-06 MED ORDER — ONDANSETRON HCL 4 MG/2ML IJ SOLN: 4.0000 mg | Freq: Once | INTRAMUSCULAR | Status: DC | PRN

## 2023-02-06 MED ORDER — 0.9 % SODIUM CHLORIDE (POUR BTL) OPTIME
TOPICAL | Status: DC | PRN
  Administered 2023-02-06: 1000 mL

## 2023-02-06 MED ORDER — ONDANSETRON HCL 4 MG/2ML IJ SOLN
INTRAMUSCULAR | Status: DC | PRN
  Administered 2023-02-06: 4 mg via INTRAVENOUS

## 2023-02-06 MED ORDER — SODIUM CHLORIDE 0.9 % IV SOLN
2.0000 g | INTRAVENOUS | Status: AC
  Administered 2023-02-06: 2 g via INTRAVENOUS

## 2023-02-06 MED ORDER — GLYCOPYRROLATE PF 0.2 MG/ML IJ SOSY
PREFILLED_SYRINGE | INTRAMUSCULAR | Status: AC
  Filled 2023-02-06: qty 1

## 2023-02-06 MED ORDER — FENTANYL CITRATE (PF) 100 MCG/2ML IJ SOLN
INTRAMUSCULAR | Status: DC | PRN
  Administered 2023-02-06 (×3): 50 ug via INTRAVENOUS

## 2023-02-06 MED ORDER — LIDOCAINE HCL (PF) 2 % IJ SOLN
INTRAMUSCULAR | Status: AC
  Filled 2023-02-06: qty 5

## 2023-02-06 MED ORDER — KETAMINE HCL 10 MG/ML IJ SOLN
INTRAMUSCULAR | Status: DC | PRN
  Administered 2023-02-06: 15 mg via INTRAVENOUS
  Administered 2023-02-06: 35 mg via INTRAVENOUS

## 2023-02-06 SURGICAL SUPPLY — 27 items
ADH SKN CLS APL DERMABOND .7 (GAUZE/BANDAGES/DRESSINGS)
APL PRP STRL LF ISPRP CHG 10.5 (MISCELLANEOUS) ×1
APPLICATOR CHLORAPREP 10.5 ORG (MISCELLANEOUS) ×2 IMPLANT
BLADE SURG SZ11 CARB STEEL (BLADE) IMPLANT
CLOTH BEACON ORANGE TIMEOUT ST (SAFETY) ×2 IMPLANT
COVER LIGHT HANDLE STERIS (MISCELLANEOUS) ×4 IMPLANT
DECANTER SPIKE VIAL GLASS SM (MISCELLANEOUS) ×2 IMPLANT
DERMABOND ADVANCED .7 DNX12 (GAUZE/BANDAGES/DRESSINGS) IMPLANT
DISSECTOR SURG LIGASURE 21 (MISCELLANEOUS) IMPLANT
DRSG TEGADERM 4X10 (GAUZE/BANDAGES/DRESSINGS) IMPLANT
ELECT REM PT RETURN 9FT ADLT (ELECTROSURGICAL) ×1
ELECTRODE REM PT RTRN 9FT ADLT (ELECTROSURGICAL) ×2 IMPLANT
GAUZE SPONGE 4X4 12PLY STRL (GAUZE/BANDAGES/DRESSINGS) IMPLANT
GLOVE BIOGEL PI IND STRL 7.0 (GLOVE) ×4 IMPLANT
GLOVE SURG SS PI 7.5 STRL IVOR (GLOVE) ×4 IMPLANT
GOWN STRL REUS W/TWL LRG LVL3 (GOWN DISPOSABLE) ×4 IMPLANT
KIT TURNOVER KIT A (KITS) ×2 IMPLANT
MANIFOLD NEPTUNE II (INSTRUMENTS) ×2 IMPLANT
NDL HYPO 25X1 1.5 SAFETY (NEEDLE) ×2 IMPLANT
NEEDLE HYPO 25X1 1.5 SAFETY (NEEDLE) ×1 IMPLANT
NS IRRIG 1000ML POUR BTL (IV SOLUTION) ×2 IMPLANT
PACK MINOR (CUSTOM PROCEDURE TRAY) ×2 IMPLANT
PAD ARMBOARD 7.5X6 YLW CONV (MISCELLANEOUS) ×2 IMPLANT
POSITIONER HEAD 8X9X4 ADT (SOFTGOODS) ×2 IMPLANT
SET BASIN LINEN APH (SET/KITS/TRAYS/PACK) ×2 IMPLANT
SYR CONTROL 10ML LL (SYRINGE) ×2 IMPLANT
TUBING SMOKE EVAC CO2 (TUBING) IMPLANT

## 2023-02-06 NOTE — Interval H&P Note (Signed)
History and Physical Interval Note:  02/06/2023 8:09 AM  Jordan Jones  has presented today for surgery, with the diagnosis of Anal lesion.  The various methods of treatment have been discussed with the patient and family. After consideration of risks, benefits and other options for treatment, the patient has consented to  Procedure(s): EXCISION MASS, ANAL (N/A) as a surgical intervention.  The patient's history has been reviewed, patient examined, no change in status, stable for surgery.  I have reviewed the patient's chart and labs.  Questions were answered to the patient's satisfaction.     Franky Macho

## 2023-02-06 NOTE — Anesthesia Preprocedure Evaluation (Signed)
Anesthesia Evaluation  Patient identified by MRN, date of birth, ID band Patient awake    Reviewed: Allergy & Precautions, H&P , NPO status , Patient's Chart, lab work & pertinent test results  Airway Mallampati: III  TM Distance: >3 FB Neck ROM: Full    Dental  (+) Dental Advisory Given, Chipped   Pulmonary Current Smoker and Patient abstained from smoking.   Pulmonary exam normal breath sounds clear to auscultation       Cardiovascular negative cardio ROS Normal cardiovascular exam Rhythm:Regular Rate:Normal     Neuro/Psych  PSYCHIATRIC DISORDERS Anxiety     negative neurological ROS     GI/Hepatic negative GI ROS, Neg liver ROS,,,  Endo/Other  negative endocrine ROS    Renal/GU negative Renal ROS  negative genitourinary   Musculoskeletal negative musculoskeletal ROS (+)    Abdominal   Peds negative pediatric ROS (+)  Hematology negative hematology ROS (+)   Anesthesia Other Findings   Reproductive/Obstetrics negative OB ROS                             Anesthesia Physical Anesthesia Plan  ASA: 2  Anesthesia Plan: General   Post-op Pain Management: Minimal or no pain anticipated   Induction: Intravenous  PONV Risk Score and Plan: 2 and Ondansetron and Dexamethasone  Airway Management Planned: LMA  Additional Equipment:   Intra-op Plan:   Post-operative Plan:   Informed Consent: I have reviewed the patients History and Physical, chart, labs and discussed the procedure including the risks, benefits and alternatives for the proposed anesthesia with the patient or authorized representative who has indicated his/her understanding and acceptance.     Dental advisory given  Plan Discussed with: CRNA and Surgeon  Anesthesia Plan Comments:        Anesthesia Quick Evaluation

## 2023-02-06 NOTE — Anesthesia Postprocedure Evaluation (Signed)
Anesthesia Post Note  Patient: Jordan Jones  Procedure(s) Performed: EXCISION MASS, ANAL (Anus)  Patient location during evaluation: Phase II Anesthesia Type: General Level of consciousness: awake and alert and oriented Pain management: pain level controlled Vital Signs Assessment: post-procedure vital signs reviewed and stable Respiratory status: spontaneous breathing, nonlabored ventilation and respiratory function stable Cardiovascular status: blood pressure returned to baseline and stable Postop Assessment: no apparent nausea or vomiting Anesthetic complications: no  No notable events documented.   Last Vitals:  Vitals:   02/06/23 1030 02/06/23 1037  BP: (!) 129/91 (!) 127/91  Pulse: (!) 53 (!) 57  Resp: 14 18  Temp:  (!) 36.3 C  SpO2: 99% 99%    Last Pain:  Vitals:   02/06/23 1037  TempSrc: Axillary  PainSc: 0-No pain                 Gae Bihl C Derel Mcglasson

## 2023-02-06 NOTE — Transfer of Care (Signed)
Immediate Anesthesia Transfer of Care Note  Patient: Jordan Jones  Procedure(s) Performed: EXCISION MASS, ANAL (Anus)  Patient Location: PACU  Anesthesia Type:General  Level of Consciousness: drowsy  Airway & Oxygen Therapy: Patient Spontanous Breathing and Patient connected to face mask oxygen  Post-op Assessment: Report given to RN and Post -op Vital signs reviewed and stable  Post vital signs: Reviewed and stable  Last Vitals:  Vitals Value Taken Time  BP    Temp    Pulse    Resp    SpO2      Last Pain:  Vitals:   02/06/23 0753  TempSrc: Oral  PainSc: 0-No pain         Complications: No notable events documented.

## 2023-02-06 NOTE — Op Note (Signed)
Patient:  Jordan Jones  DOB:  [birth year]  MRN:  [id number]   Preop Diagnosis: Perianal condyloma  Postop Diagnosis: Same  Procedure: Excision and fulguration of perianal condyloma  Surgeon: Franky Macho, MD  Anes: General endotracheal  Indications: Patient is a 52 year old white male who presents with extensive perianal condyloma as well as penile shaft skin lesion.  I discussed the case with urology and Dr. Ronne Binning will follow-up with the patient as an outpatient to address the penile shaft lesion.  The risks and benefits of the procedure including bleeding, infection, pain, and the need for further surgery as I will not be able to fully treat this disease at 1 sitting were fully explained to the patient, who gave informed consent.  Procedure note: The patient was placed in the lithotomy position after induction of general endotracheal anesthesia.  The perineum was prepped and draped using usual sterile technique with Betadine.  Surgical site confirmation was performed.  The patient had multiple perianal condyloma especially along the left side of the anus and into the anal verge.  Using both a LigaSure and cautery, I was able to remove an extensive amount of condyloma.  Given the extent of the disease, I was unable to fully handle every single condyloma.  Along the right side of the anus, a broad-based condylomatous lesion was noted but it extended into the subcutaneous tissue.  I started by excision anteriorly but realized that there was invasion into the subcutaneous tissue.  This tissue was sent separately for pathology as it was possibly malignant.  I was able to handle the lesions around the anal orifice.  Viscous Xylocaine gel was placed over the wound.  All tape and needle counts were correct at the end of the procedure.  The patient was extubated in the operating room and transferred to PACU in stable condition.  Complications: None  EBL: Minimal  Specimen: Perianal  condyloma, right perianal biopsy    Media Information  Document Information  Photos    02/06/2023 08:51  Attached To:  Hospital Encounter on 02/06/23  Source Information  Franky Macho, MD  [facility]  Document History

## 2023-02-06 NOTE — Anesthesia Procedure Notes (Signed)
Procedure Name: LMA Insertion Date/Time: 02/06/2023 8:45 AM  Performed by: Julian Reil, CRNAPre-anesthesia Checklist: Emergency Drugs available, Patient identified, Suction available and Patient being monitored Patient Re-evaluated:Patient Re-evaluated prior to induction Oxygen Delivery Method: Circle system utilized Preoxygenation: Pre-oxygenation with 100% oxygen Induction Type: IV induction Ventilation: Mask ventilation without difficulty LMA: LMA inserted LMA Size: 5.0 Tube type: Oral Number of attempts: 2 Placement Confirmation: positive ETCO2 Tube secured with: Tape Dental Injury: Teeth and Oropharynx as per pre-operative assessment  Comments: 1st attempt LMA AuraGain 4 with gastric port placed, no EtCO2 noted. 2nd attempt LMA 5 as noted above.

## 2023-02-07 LAB — SURGICAL PATHOLOGY

## 2023-02-11 ENCOUNTER — Encounter (HOSPITAL_COMMUNITY): Payer: Self-pay | Admitting: General Surgery

## 2023-02-14 ENCOUNTER — Ambulatory Visit (INDEPENDENT_AMBULATORY_CARE_PROVIDER_SITE_OTHER): Payer: No Typology Code available for payment source | Admitting: General Surgery

## 2023-02-14 ENCOUNTER — Encounter: Payer: Self-pay | Admitting: General Surgery

## 2023-02-14 VITALS — BP 125/80 | HR 75 | Temp 98.2°F | Resp 12 | Ht 71.0 in | Wt 187.0 lb

## 2023-02-14 DIAGNOSIS — Z09 Encounter for follow-up examination after completed treatment for conditions other than malignant neoplasm: Secondary | ICD-10-CM

## 2023-02-14 NOTE — Progress Notes (Signed)
Subjective:     Jordan Jones  Patient here for postoperative visit, status post fulguration of perianal condyloma.  Patient states that the Silvadene cream is helping.  He is also using hemorrhoidal cream.  He is getting better.  Less drainage has been noted. Objective:    BP 125/80   Pulse 75   Temp 98.2 F (36.8 C) (Oral)   Resp 12   Ht 5\' 11"  (1.803 m)   Wt 187 lb (84.8 kg)   SpO2 97%   BMI 26.08 kg/m   General:  alert, cooperative, and no distress  Perianal region is healing well.  There is still extensive condyloma present. Final pathology was negative for malignancy.     Assessment:    Doing well postoperatively.    Plan:   He realizes that this has to be a staged procedure given the extensive disease process.  He will return in approximately 3 months after he has recovered to schedule further fulguration of anal condyloma.

## 2023-04-04 ENCOUNTER — Other Ambulatory Visit: Payer: Self-pay | Admitting: *Deleted

## 2023-04-04 DIAGNOSIS — A630 Anogenital (venereal) warts: Secondary | ICD-10-CM

## 2023-04-23 NOTE — H&P (Signed)
Jordan Jones [place]; [id number]; Nov 16, 1970   HPI Patient is a 52 year old white male who was referred to my care by the emergency room for evaluation and treatment of recurrent condyloma.  Patient has had previous condyloma removed from his penis but now has extensive condyloma of the anus.  He is having a difficult time keeping himself clean.  He is status post excision of condyloma around the anus in July 2024.  He is back for a staged excision. Past Medical History:  Diagnosis Date   Anxiety     Past Surgical History:  Procedure Laterality Date   FINGER SURGERY  1990   severed finger left pinky     Family History  Problem Relation Age of Onset   Heart attack Other     No current outpatient medications on file prior to visit.   No current facility-administered medications on file prior to visit.    No Known Allergies  Social History   Substance and Sexual Activity  Alcohol Use Never    Social History   Tobacco Use  Smoking Status Every Day   Packs/day: 1.5   Types: Cigarettes  Smokeless Tobacco Never    Review of Systems  Constitutional: Negative.   HENT: Negative.    Eyes: Negative.   Respiratory: Negative.    Cardiovascular: Negative.   Gastrointestinal:  Positive for abdominal pain.  Genitourinary:  Positive for frequency.  Musculoskeletal:  Positive for joint pain.  Skin: Negative.   Neurological: Negative.   Endo/Heme/Allergies: Negative.   Psychiatric/Behavioral: Negative.      Objective   Vitals:   01/22/23 1009  BP: 138/83  Pulse: (!) 58  Resp: 16  Temp: 97.8 F (36.6 C)  SpO2: 98%    Physical Exam Vitals reviewed.  Constitutional:      Appearance: Normal appearance. He is normal weight. He is not ill-appearing.  HENT:     Head: Normocephalic and atraumatic.  Cardiovascular:     Rate and Rhythm: Normal rate and regular rhythm.     Heart sounds: Normal heart sounds. No murmur heard.    No friction rub. No gallop.  Pulmonary:      Effort: Pulmonary effort is normal. No respiratory distress.     Breath sounds: Normal breath sounds. No stridor. No wheezing, rhonchi or rales.  Genitourinary:    Comments: Extensive condyloma noted around the anus.  Examination of the anal canal is limited secondary to the extensiveness of the condyloma. Skin:    General: Skin is warm and dry.  Neurological:     Mental Status: He is alert and oriented to person, place, and time.     Assessment  Condyloma acuminatum of anus, extensive Plan  Patient is scheduled for excision of the condyloma of the anus on 05/10/2023.  The risks and benefits of the procedure including bleeding, infection, pain, the possibility of recurrence of the condyloma were fully explained to the patient, who gave informed consent.  I told him that I would probably not be able to fully excise all the condyloma at this sitting.  He understands.

## 2023-05-03 NOTE — Patient Instructions (Addendum)
Jordan Jones  05/03/2023     @PREFPERIOPPHARMACY @   Your procedure is scheduled on  05/10/2023.   Report to [facility] at  0600  A.M.   Call this number if you have problems the morning of surgery:  [phone]  If you experience any cold or flu symptoms such as cough, fever, chills, shortness of breath, etc. between now and your scheduled surgery, please notify us at the above number.   Remember:  Do not eat after midnight.    You may drink clear liquids until 0330 am on 05/10/2023.    Clear liquids allowed are:                    Water, Black Coffee Only (No creamer, milk or cream, including half & half and powdered creamer), and Clear Sports drink (No red color; diabetics please choose diet or no sugar options)     Take these medicines the morning of surgery with A SIP OF WATER                                             None.     Do not wear jewelry, make-up or nail polish, including gel polish,  artificial nails, or any other type of covering on natural nails (fingers and  toes).  Do not wear lotions, powders, or perfumes, or deodorant.  Do not shave 48 hours prior to surgery.  Men may shave face and neck.  Do not bring valuables to the hospital.  [facility] is not responsible for any belongings or valuables.  Contacts, dentures or bridgework may not be worn into surgery.  Leave your suitcase in the car.  After surgery it may be brought to your room.  For patients admitted to the hospital, discharge time will be determined by your treatment team.  Patients discharged the day of surgery will not be allowed to drive home and must have someone with them for 24 hours.    Special instructions:     DO NOT smoke tobacco or vape for 24 hours before your procedure.   Please read over the following fact sheets that you were given. Coughing and Deep Breathing, Anesthesia Post-op Instructions, and Care and Recovery After Surgery         Anal  Fistulotomy, Care After After an anal fistulotomy, it is common to have: Some pain, discomfort, and swelling. More pain when you poop. Some bleeding from the incision. Some leakage of poop (stool). Follow these instructions at home: Medicines Take over-the-counter and prescription medicines only as told by your health care provider. If you were prescribed antibiotics, take them as told by your provider. Do not stop using the antibiotic even if you start to feel better. Ask your provider if the medicine prescribed to you requires you to avoid driving or using machinery. Incision care  Follow instructions from your provider about how to take care of your incision. Make sure you: Wash your hands with soap and water for at least 20 seconds before and after you remove your bandage (dressing). If soap and water are not available, use hand sanitizer. Remove your dressing as told by your provider. In some cases, you may be told not to remove your dressing. You may need to let it come out with your first poop after surgery. Keep the  incision area clean and dry. Check your incision area every day for signs of infection. Check for: More redness, swelling, or pain. More fluid or blood. Warmth. Pus or a bad smell. Self-care  After you poop, clean the incision area. Use one of these methods: Gently wipe with a moist towelette. Gently wipe with mild soap and water. Take a shower. Take a sitz bath. This is a shallow, warm-water bath that attaches to the toilet bowl. You can also sit in a bathtub filled with warm water. If told, put ice on the incision area. Put ice in a plastic bag. Place a towel between your skin and the bag. Leave the ice on for 20 minutes, 2-3 times a day. If your skin turns bright red, remove the ice right away to prevent skin damage. The risk of damage is higher if you cannot feel pain, heat, or cold. Managing constipation To prevent or treat constipation, you may need  to: Drink enough fluid to keep your pee (urine) pale yellow. Take over-the-counter or prescription medicines. Eat foods that are high in fiber, such as beans, whole grains, and fresh fruits and vegetables. Limit foods that are high in fat and processed sugars, such as fried or sweet foods. Activity If you were given a sedative during the procedure, it can affect you for several hours. Do not drive or operate machinery until your provider says that it is safe. Rest as told by your provider. Do not sit for a long time without moving. Get up to take short walks every 1-2 hours. This will improve blood flow and breathing. Ask for help if you feel weak or unsteady. You may have to avoid lifting. Ask your provider how much you can safely lift. Return to your normal activities as told by your provider. Ask your provider what activities are safe for you. General instructions Follow instructions from your provider about what you may eat and drink. Do not use any products that contain nicotine or tobacco. These products include cigarettes, chewing tobacco, and vaping devices, such as e-cigarettes. If you need help quitting, ask your provider. If you have bleeding from the incision, wear a pad to absorb blood. Change it often. Do not swim or use a hot tub until your provider approves. Your provider may give you more instructions. Make sure you know what you can and cannot do. Contact a health care provider if: You have any signs of infection. You have a fever or chills. You have swelling or tenderness near your groin. You cannot control when you pee (urinate) or poop (incontinence). You are leaking poop. You have trouble peeing. Your pain does not get better with medicine. Get help right away if: You have severe pain in your abdomen or near your incision. You have sudden chest pain. You become weak or you faint. You have bleeding from your incision that soaks 2 or more pads in 24 hours. These  symptoms may be an emergency. Get help right away. Call 911. Do not wait to see if the symptoms will go away. Do not drive yourself to the hospital. This information is not intended to replace advice given to you by your health care provider. Make sure you discuss any questions you have with your health care provider. Document Revised: 05/15/2022 Document Reviewed: 05/15/2022 Elsevier Patient Education  2024 [employer] General Anesthesia, Adult, Care After The following information offers guidance on how to care for yourself after your procedure. Your health care provider may also give you  more specific instructions. If you have problems or questions, contact your health care provider. What can I expect after the procedure? After the procedure, it is common for people to: Have pain or discomfort at the IV site. Have nausea or vomiting. Have a sore throat or hoarseness. Have trouble concentrating. Feel cold or chills. Feel weak, sleepy, or tired (fatigue). Have soreness and body aches. These can affect parts of the body that were not involved in surgery. Follow these instructions at home: For the time period you were told by your health care provider:  Rest. Do not participate in activities where you could fall or become injured. Do not drive or use machinery. Do not drink alcohol. Do not take sleeping pills or medicines that cause drowsiness. Do not make important decisions or sign legal documents. Do not take care of children on your own. General instructions Drink enough fluid to keep your urine pale yellow. If you have sleep apnea, surgery and certain medicines can increase your risk for breathing problems. Follow instructions from your health care provider about wearing your sleep device: Anytime you are sleeping, including during daytime naps. While taking prescription pain medicines, sleeping medicines, or medicines that make you drowsy. Return to your normal activities as  told by your health care provider. Ask your health care provider what activities are safe for you. Take over-the-counter and prescription medicines only as told by your health care provider. Do not use any products that contain nicotine or tobacco. These products include cigarettes, chewing tobacco, and vaping devices, such as e-cigarettes. These can delay incision healing after surgery. If you need help quitting, ask your health care provider. Contact a health care provider if: You have nausea or vomiting that does not get better with medicine. You vomit every time you eat or drink. You have pain that does not get better with medicine. You cannot urinate or have bloody urine. You develop a skin rash. You have a fever. Get help right away if: You have trouble breathing. You have chest pain. You vomit blood. These symptoms may be an emergency. Get help right away. Call 911. Do not wait to see if the symptoms will go away. Do not drive yourself to the hospital. Summary After the procedure, it is common to have a sore throat, hoarseness, nausea, vomiting, or to feel weak, sleepy, or fatigue. For the time period you were told by your health care provider, do not drive or use machinery. Get help right away if you have difficulty breathing, have chest pain, or vomit blood. These symptoms may be an emergency. This information is not intended to replace advice given to you by your health care provider. Make sure you discuss any questions you have with your health care provider. Document Revised: 10/06/2021 Document Reviewed: 10/06/2021 Elsevier Patient Education  2024 Else[employer] Inc. How to Use Chlorhexidine Before Surgery Chlorhexidine gluconate (CHG) is a germ-killing (antiseptic) solution that is used to clean the skin. It can get rid of the bacteria that normally live on the skin and can keep them away for about 24 hours. To clean your skin with CHG, you may be given: A CHG solution to use in  the shower or as part of a sponge bath. A prepackaged cloth that contains CHG. Cleaning your skin with CHG may help lower the risk for infection: While you are staying in the intensive care unit of the hospital. If you have a vascular access, such as a central line, to provide short-term or long-term  access to your veins. If you have a catheter to drain urine from your bladder. If you are on a ventilator. A ventilator is a machine that helps you breathe by moving air in and out of your lungs. After surgery. What are the risks? Risks of using CHG include: A skin reaction. Hearing loss, if CHG gets in your ears and you have a perforated eardrum. Eye injury, if CHG gets in your eyes and is not rinsed out. The CHG product catching fire. Make sure that you avoid smoking and flames after applying CHG to your skin. Do not use CHG: If you have a chlorhexidine allergy or have previously reacted to chlorhexidine. On babies younger than 58 months of age. How to use CHG solution Use CHG only as told by your health care provider, and follow the instructions on the label. Use the full amount of CHG as directed. Usually, this is one bottle. During a shower Follow these steps when using CHG solution during a shower (unless your health care provider gives you different instructions): Start the shower. Use your normal soap and shampoo to wash your face and hair. Turn off the shower or move out of the shower stream. Pour the CHG onto a clean washcloth. Do not use any type of brush or rough-edged sponge. Starting at your neck, lather your body down to your toes. Make sure you follow these instructions: If you will be having surgery, pay special attention to the part of your body where you will be having surgery. Scrub this area for at least 1 minute. Do not use CHG on your head or face. If the solution gets into your ears or eyes, rinse them well with water. Avoid your genital area. Avoid any areas of skin  that have broken skin, cuts, or scrapes. Scrub your back and under your arms. Make sure to wash skin folds. Let the lather sit on your skin for 1-2 minutes or as long as told by your health care provider. Thoroughly rinse your entire body in the shower. Make sure that all body creases and crevices are rinsed well. Dry off with a clean towel. Do not put any substances on your body afterward--such as powder, lotion, or perfume--unless you are told to do so by your health care provider. Only use lotions that are recommended by the manufacturer. Put on clean clothes or pajamas. If it is the night before your surgery, sleep in clean sheets.  During a sponge bath Follow these steps when using CHG solution during a sponge bath (unless your health care provider gives you different instructions): Use your normal soap and shampoo to wash your face and hair. Pour the CHG onto a clean washcloth. Starting at your neck, lather your body down to your toes. Make sure you follow these instructions: If you will be having surgery, pay special attention to the part of your body where you will be having surgery. Scrub this area for at least 1 minute. Do not use CHG on your head or face. If the solution gets into your ears or eyes, rinse them well with water. Avoid your genital area. Avoid any areas of skin that have broken skin, cuts, or scrapes. Scrub your back and under your arms. Make sure to wash skin folds. Let the lather sit on your skin for 1-2 minutes or as long as told by your health care provider. Using a different clean, wet washcloth, thoroughly rinse your entire body. Make sure that all body creases and crevices  are rinsed well. Dry off with a clean towel. Do not put any substances on your body afterward--such as powder, lotion, or perfume--unless you are told to do so by your health care provider. Only use lotions that are recommended by the manufacturer. Put on clean clothes or pajamas. If it is the  night before your surgery, sleep in clean sheets. How to use CHG prepackaged cloths Only use CHG cloths as told by your health care provider, and follow the instructions on the label. Use the CHG cloth on clean, dry skin. Do not use the CHG cloth on your head or face unless your health care provider tells you to. When washing with the CHG cloth: Avoid your genital area. Avoid any areas of skin that have broken skin, cuts, or scrapes. Before surgery Follow these steps when using a CHG cloth to clean before surgery (unless your health care provider gives you different instructions): Using the CHG cloth, vigorously scrub the part of your body where you will be having surgery. Scrub using a back-and-forth motion for 3 minutes. The area on your body should be completely wet with CHG when you are done scrubbing. Do not rinse. Discard the cloth and let the area air-dry. Do not put any substances on the area afterward, such as powder, lotion, or perfume. Put on clean clothes or pajamas. If it is the night before your surgery, sleep in clean sheets.  For general bathing Follow these steps when using CHG cloths for general bathing (unless your health care provider gives you different instructions). Use a separate CHG cloth for each area of your body. Make sure you wash between any folds of skin and between your fingers and toes. Wash your body in the following order, switching to a new cloth after each step: The front of your neck, shoulders, and chest. Both of your arms, under your arms, and your hands. Your stomach and groin area, avoiding the genitals. Your right leg and foot. Your left leg and foot. The back of your neck, your back, and your buttocks. Do not rinse. Discard the cloth and let the area air-dry. Do not put any substances on your body afterward--such as powder, lotion, or perfume--unless you are told to do so by your health care provider. Only use lotions that are recommended by the  manufacturer. Put on clean clothes or pajamas. Contact a health care provider if: Your skin gets irritated after scrubbing. You have questions about using your solution or cloth. You swallow any chlorhexidine. Call your local poison control center ([phone] in the [place] Get help right away if: Your eyes itch badly, or they become very red or swollen. Your skin itches badly and is red or swollen. Your hearing changes. You have trouble seeing. You have swelling or tingling in your mouth or throat. You have trouble breathing. These symptoms may represent a serious problem that is an emergency. Do not wait to see if the symptoms will go away. Get medical help right away. Call your local emergency services (911 in the [place] Do not drive yourself to the hospital. Summary Chlorhexidine gluconate (CHG) is a germ-killing (antiseptic) solution that is used to clean the skin. Cleaning your skin with CHG may help to lower your risk for infection. You may be given CHG to use for bathing. It may be in a bottle or in a prepackaged cloth to use on your skin. Carefully follow your health care provider's instructions and the instructions on the product label. Do not use  CHG if you have a chlorhexidine allergy. Contact your health care provider if your skin gets irritated after scrubbing. This information is not intended to replace advice given to you by your health care provider. Make sure you discuss any questions you have with your health care provider. Document Revised: 11/06/2021 Document Reviewed: 09/19/2020 Elsevier Patient Education  2023 [employer].

## 2023-05-06 ENCOUNTER — Encounter (HOSPITAL_COMMUNITY): Payer: Self-pay

## 2023-05-06 ENCOUNTER — Encounter (HOSPITAL_COMMUNITY)
Admission: RE | Admit: 2023-05-06 | Discharge: 2023-05-06 | Disposition: A | Discharge status: Home / Self Care | Payer: No Typology Code available for payment source | Source: Ambulatory Visit | Attending: General Surgery | Admitting: General Surgery

## 2023-05-06 VITALS — BP 126/79 | HR 67 | Temp 98.4°F | Resp 18 | Ht 71.0 in | Wt 186.0 lb

## 2023-05-06 DIAGNOSIS — Z01818 Encounter for other preprocedural examination: Secondary | ICD-10-CM | POA: Diagnosis present

## 2023-05-06 DIAGNOSIS — Z720 Tobacco use: Secondary | ICD-10-CM | POA: Diagnosis not present

## 2023-05-06 DIAGNOSIS — Z01810 Encounter for preprocedural cardiovascular examination: Secondary | ICD-10-CM | POA: Insufficient documentation

## 2023-05-10 ENCOUNTER — Encounter (HOSPITAL_COMMUNITY)
Admission: RE | Disposition: A | Discharge status: Home / Self Care | Payer: Self-pay | Source: Home / Self Care | Attending: General Surgery

## 2023-05-10 ENCOUNTER — Ambulatory Visit (HOSPITAL_BASED_OUTPATIENT_CLINIC_OR_DEPARTMENT_OTHER): Payer: No Typology Code available for payment source | Admitting: Anesthesiology

## 2023-05-10 ENCOUNTER — Ambulatory Visit (HOSPITAL_COMMUNITY): Payer: No Typology Code available for payment source | Admitting: Anesthesiology

## 2023-05-10 ENCOUNTER — Ambulatory Visit (HOSPITAL_COMMUNITY)
Admission: RE | Admit: 2023-05-10 | Discharge: 2023-05-10 | Disposition: A | Discharge status: Home / Self Care | Payer: No Typology Code available for payment source | Attending: General Surgery | Admitting: General Surgery

## 2023-05-10 DIAGNOSIS — A5131 Condyloma latum: Secondary | ICD-10-CM

## 2023-05-10 DIAGNOSIS — M2550 Pain in unspecified joint: Secondary | ICD-10-CM | POA: Insufficient documentation

## 2023-05-10 DIAGNOSIS — A630 Anogenital (venereal) warts: Secondary | ICD-10-CM | POA: Diagnosis present

## 2023-05-10 DIAGNOSIS — K648 Other hemorrhoids: Secondary | ICD-10-CM | POA: Insufficient documentation

## 2023-05-10 DIAGNOSIS — F419 Anxiety disorder, unspecified: Secondary | ICD-10-CM | POA: Insufficient documentation

## 2023-05-10 DIAGNOSIS — R109 Unspecified abdominal pain: Secondary | ICD-10-CM | POA: Insufficient documentation

## 2023-05-10 DIAGNOSIS — F17210 Nicotine dependence, cigarettes, uncomplicated: Secondary | ICD-10-CM | POA: Diagnosis not present

## 2023-05-10 DIAGNOSIS — K644 Residual hemorrhoidal skin tags: Secondary | ICD-10-CM | POA: Diagnosis not present

## 2023-05-10 DIAGNOSIS — R350 Frequency of micturition: Secondary | ICD-10-CM | POA: Insufficient documentation

## 2023-05-10 HISTORY — PX: MASS EXCISION: SHX2000

## 2023-05-10 SURGERY — EXCISION MASS
Anesthesia: General | Site: Anus

## 2023-05-10 MED ORDER — PROPOFOL 10 MG/ML IV BOLUS
INTRAVENOUS | Status: DC | PRN
  Administered 2023-05-10: 200 mg via INTRAVENOUS

## 2023-05-10 MED ORDER — KETOROLAC TROMETHAMINE 30 MG/ML IJ SOLN: 30.0000 mg | Freq: Once | INTRAMUSCULAR | Status: DC

## 2023-05-10 MED ORDER — DEXAMETHASONE SODIUM PHOSPHATE 10 MG/ML IJ SOLN
INTRAMUSCULAR | Status: DC | PRN
  Administered 2023-05-10: 10 mg via INTRAVENOUS

## 2023-05-10 MED ORDER — BUPIVACAINE HCL (PF) 0.5 % IJ SOLN
INTRAMUSCULAR | Status: DC | PRN
  Administered 2023-05-10: 25 mL

## 2023-05-10 MED ORDER — LIDOCAINE HCL (PF) 2 % IJ SOLN
INTRAMUSCULAR | Status: AC
  Filled 2023-05-10: qty 5

## 2023-05-10 MED ORDER — ONDANSETRON HCL 4 MG/2ML IJ SOLN
INTRAMUSCULAR | Status: AC
  Filled 2023-05-10: qty 2

## 2023-05-10 MED ORDER — SILVER SULFADIAZINE 1 % EX CREA: 1.0000 | TOPICAL_CREAM | Freq: Two times a day (BID) | CUTANEOUS | 1 refills | Status: DC

## 2023-05-10 MED ORDER — 0.9 % SODIUM CHLORIDE (POUR BTL) OPTIME
TOPICAL | Status: DC | PRN
  Administered 2023-05-10: 1000 mL

## 2023-05-10 MED ORDER — DEXAMETHASONE SODIUM PHOSPHATE 10 MG/ML IJ SOLN
INTRAMUSCULAR | Status: AC
  Filled 2023-05-10: qty 1

## 2023-05-10 MED ORDER — GLYCOPYRROLATE 0.2 MG/ML IJ SOLN
INTRAMUSCULAR | Status: DC | PRN
Start: 2023-05-10 — End: 2023-05-10
  Administered 2023-05-10 (×2): .2 mg via INTRAVENOUS

## 2023-05-10 MED ORDER — BUPIVACAINE HCL (PF) 0.5 % IJ SOLN
INTRAMUSCULAR | Status: AC
  Filled 2023-05-10: qty 30

## 2023-05-10 MED ORDER — ONDANSETRON HCL 4 MG/2ML IJ SOLN
INTRAMUSCULAR | Status: DC | PRN
  Administered 2023-05-10: 4 mg via INTRAVENOUS

## 2023-05-10 MED ORDER — FENTANYL CITRATE (PF) 100 MCG/2ML IJ SOLN
INTRAMUSCULAR | Status: DC | PRN
  Administered 2023-05-10 (×2): 50 ug via INTRAVENOUS
  Administered 2023-05-10: 100 ug via INTRAVENOUS

## 2023-05-10 MED ORDER — EPHEDRINE SULFATE (PRESSORS) 50 MG/ML IJ SOLN
INTRAMUSCULAR | Status: DC | PRN
  Administered 2023-05-10: 10 mg via INTRAVENOUS

## 2023-05-10 MED ORDER — EPHEDRINE 5 MG/ML INJ
INTRAVENOUS | Status: AC
  Filled 2023-05-10: qty 5

## 2023-05-10 MED ORDER — OXYCODONE HCL 5 MG PO TABS
5.0000 mg | ORAL_TABLET | Freq: Four times a day (QID) | ORAL | 0 refills | Status: DC | PRN
Start: 2023-05-10 — End: 2023-05-30

## 2023-05-10 MED ORDER — CHLORHEXIDINE GLUCONATE 0.12 % MT SOLN: 15.0000 mL | Freq: Once | OROMUCOSAL | Status: DC

## 2023-05-10 MED ORDER — DEXMEDETOMIDINE HCL IN NACL 80 MCG/20ML IV SOLN
INTRAVENOUS | Status: DC | PRN
  Administered 2023-05-10: 4 ug via INTRAVENOUS

## 2023-05-10 MED ORDER — CHLORHEXIDINE GLUCONATE CLOTH 2 % EX PADS: 6.0000 | MEDICATED_PAD | Freq: Once | CUTANEOUS | Status: DC

## 2023-05-10 MED ORDER — ORAL CARE MOUTH RINSE: 15.0000 mL | Freq: Once | OROMUCOSAL | Status: DC

## 2023-05-10 MED ORDER — LACTATED RINGERS IV SOLN
INTRAVENOUS | Status: DC | PRN
Start: 2023-05-10 — End: 2023-05-10

## 2023-05-10 MED ORDER — DEXMEDETOMIDINE HCL IN NACL 80 MCG/20ML IV SOLN
INTRAVENOUS | Status: AC
  Filled 2023-05-10: qty 20

## 2023-05-10 MED ORDER — PROPOFOL 10 MG/ML IV BOLUS
INTRAVENOUS | Status: AC
  Filled 2023-05-10: qty 20

## 2023-05-10 MED ORDER — LACTATED RINGERS IV SOLN: INTRAVENOUS | Status: DC

## 2023-05-10 MED ORDER — SEVOFLURANE IN SOLN
RESPIRATORY_TRACT | Status: AC
  Filled 2023-05-10: qty 250

## 2023-05-10 MED ORDER — FENTANYL CITRATE (PF) 100 MCG/2ML IJ SOLN
INTRAMUSCULAR | Status: AC
  Filled 2023-05-10: qty 2

## 2023-05-10 MED ORDER — SODIUM CHLORIDE 0.9 % IV SOLN
2.0000 g | INTRAVENOUS | Status: AC
  Administered 2023-05-10: 2 g via INTRAVENOUS
  Filled 2023-05-10: qty 2

## 2023-05-10 SURGICAL SUPPLY — 37 items
ADH SKN CLS APL DERMABOND .7 (GAUZE/BANDAGES/DRESSINGS)
APL PRP STRL LF ISPRP CHG 10.5 (MISCELLANEOUS) ×1
APPLICATOR CHLORAPREP 10.5 ORG (MISCELLANEOUS) ×2 IMPLANT
CLOTH BEACON ORANGE TIMEOUT ST (SAFETY) ×2 IMPLANT
COVER LIGHT HANDLE STERIS (MISCELLANEOUS) ×4 IMPLANT
COVER MAYO STAND XLG (MISCELLANEOUS) IMPLANT
DECANTER SPIKE VIAL GLASS SM (MISCELLANEOUS) ×2 IMPLANT
DERMABOND ADVANCED .7 DNX12 (GAUZE/BANDAGES/DRESSINGS) IMPLANT
DISSECTOR SURG LIGASURE 21 (MISCELLANEOUS) IMPLANT
DRSG TEGADERM 8X12 (GAUZE/BANDAGES/DRESSINGS) IMPLANT
ELECT NDL TIP 2.8 STRL (NEEDLE) IMPLANT
ELECT NEEDLE TIP 2.8 STRL (NEEDLE)
ELECT REM PT RETURN 9FT ADLT (ELECTROSURGICAL) ×1
ELECTRODE REM PT RTRN 9FT ADLT (ELECTROSURGICAL) ×2 IMPLANT
GAUZE SPONGE 4X4 12PLY STRL (GAUZE/BANDAGES/DRESSINGS) IMPLANT
GLOVE BIOGEL PI IND STRL 7.0 (GLOVE) ×4 IMPLANT
GLOVE SURG SS PI 7.5 STRL IVOR (GLOVE) ×4 IMPLANT
GOWN STRL REUS W/TWL LRG LVL3 (GOWN DISPOSABLE) ×4 IMPLANT
KIT TURNOVER KIT A (KITS) ×2 IMPLANT
MANIFOLD NEPTUNE II (INSTRUMENTS) ×2 IMPLANT
NDL HYPO 25X1 1.5 SAFETY (NEEDLE) ×2 IMPLANT
NEEDLE HYPO 25X1 1.5 SAFETY (NEEDLE) ×1
NS IRRIG 1000ML POUR BTL (IV SOLUTION) ×2 IMPLANT
PACK MINOR (CUSTOM PROCEDURE TRAY) ×2 IMPLANT
PAD ARMBOARD 7.5X6 YLW CONV (MISCELLANEOUS) ×2 IMPLANT
POSITIONER HEAD 8X9X4 ADT (SOFTGOODS) ×2 IMPLANT
SET BASIN LINEN APH (SET/KITS/TRAYS/PACK) ×2 IMPLANT
SHEET LAVH (DRAPES) IMPLANT
SURGILUBE 2OZ TUBE FLIPTOP (MISCELLANEOUS) IMPLANT
SUT ETHILON 3 0 FSL (SUTURE) IMPLANT
SUT MNCRL AB 4-0 PS2 18 (SUTURE) IMPLANT
SUT PROLENE 3 0 PS 1 (SUTURE) IMPLANT
SUT PROLENE 4 0 PS 2 18 (SUTURE) IMPLANT
SUT VIC AB 3-0 SH 27 (SUTURE)
SUT VIC AB 3-0 SH 27X BRD (SUTURE) IMPLANT
SYR 30ML LL (SYRINGE) IMPLANT
SYR CONTROL 10ML LL (SYRINGE) ×2 IMPLANT

## 2023-05-10 NOTE — Anesthesia Postprocedure Evaluation (Signed)
Anesthesia Post Note  Patient: Jordan Jones  Procedure(s) Performed: EXCISION MASS, ANAL (Anus)  Patient location during evaluation: PACU Anesthesia Type: General Level of consciousness: awake and alert Pain management: pain level controlled Vital Signs Assessment: post-procedure vital signs reviewed and stable Respiratory status: spontaneous breathing, nonlabored ventilation, respiratory function stable and patient connected to nasal cannula oxygen Cardiovascular status: blood pressure returned to baseline and stable Postop Assessment: no apparent nausea or vomiting Anesthetic complications: no   No notable events documented.   Last Vitals:  Vitals:   05/10/23 0715 05/10/23 0722  BP:    Pulse: 62 60  Resp: 19 (!) 22  Temp:    SpO2: 99% 99%    Last Pain:  Vitals:   05/10/23 4098  PainSc: 0-No pain                 Windell Norfolk

## 2023-05-10 NOTE — Interval H&P Note (Signed)
History and Physical Interval Note:  05/10/2023 7:12 AM  Jordan Jones  has presented today for surgery, with the diagnosis of Anal lesion.  The various methods of treatment have been discussed with the patient and family. After consideration of risks, benefits and other options for treatment, the patient has consented to  Procedure(s): EXCISION MASS, ANAL (N/A) as a surgical intervention.  The patient's history has been reviewed, patient examined, no change in status, stable for surgery.  I have reviewed the patient's chart and labs.  Questions were answered to the patient's satisfaction.     Franky Macho

## 2023-05-10 NOTE — Op Note (Signed)
Patient:  Jordan Jones  DOB:  February 18, 1971  MRN:  [id number]   Preop Diagnosis: Perianal condyloma  Postop Diagnosis: Same  Procedure: Excision and fulguration of perianal condyloma  Surgeon: Franky Macho, MD  Anes: General  Indications: Patient is a 52 year old white male status post excision and fulguration of perianal condyloma in July who now presents for additional treatment due to the extensiveness of the condyloma.  The risks and benefits of the procedure were fully explained to the patient, who gave informed consent.  Procedure note: The patient was placed in the lithotomy position after general anesthesia was administered.  The perineum was prepped and draped using usual sterile technique with Betadine.  Surgical site confirmation was performed.  Multiple perianal condyloma were fulgurated using Bovie electrocautery.  A large condyloma was excised using the LigaSure.  There was an external and internal hemorrhoid that was involved with the condyloma and this was excised using the LigaSure.  0.5% Sensorcaine was instilled into the surrounding perineum.  All tape and needle counts were correct at the end of the procedure.  The patient was awakened and transferred to PACU in stable condition.  Complications: None  EBL: None  Specimen: Perianal condyloma

## 2023-05-10 NOTE — Anesthesia Procedure Notes (Signed)
Procedure Name: LMA Insertion Date/Time: 05/10/2023 7:31 AM  Performed by: Franco Nones, CRNAPre-anesthesia Checklist: Patient identified, Patient being monitored, Emergency Drugs available, Timeout performed and Suction available Patient Re-evaluated:Patient Re-evaluated prior to induction Oxygen Delivery Method: Circle System Utilized Preoxygenation: Pre-oxygenation with 100% oxygen Induction Type: IV induction Ventilation: Mask ventilation without difficulty LMA: LMA inserted LMA Size: 5.0 Number of attempts: 1 Placement Confirmation: positive ETCO2 and breath sounds checked- equal and bilateral Tube secured with: Tape Dental Injury: Teeth and Oropharynx as per pre-operative assessment

## 2023-05-10 NOTE — Anesthesia Preprocedure Evaluation (Signed)
Anesthesia Evaluation  Patient identified by MRN, date of birth, ID band Patient awake    Reviewed: Allergy & Precautions, H&P , NPO status , Patient's Chart, lab work & pertinent test results, reviewed documented beta blocker date and time   Airway Mallampati: II  TM Distance: >3 FB Neck ROM: full    Dental no notable dental hx. (+) Dental Advisory Given, Chipped   Pulmonary neg pulmonary ROS, Current Smoker and Patient abstained from smoking.   Pulmonary exam normal breath sounds clear to auscultation       Cardiovascular Exercise Tolerance: Good negative cardio ROS Normal cardiovascular exam Rhythm:regular Rate:Normal     Neuro/Psych   Anxiety     negative neurological ROS  negative psych ROS   GI/Hepatic negative GI ROS, Neg liver ROS,,,  Endo/Other  negative endocrine ROS    Renal/GU negative Renal ROS  negative genitourinary   Musculoskeletal negative musculoskeletal ROS (+)    Abdominal   Peds negative pediatric ROS (+)  Hematology negative hematology ROS (+)   Anesthesia Other Findings   Reproductive/Obstetrics negative OB ROS                              Anesthesia Physical Anesthesia Plan  ASA: 2  Anesthesia Plan: General   Post-op Pain Management: Minimal or no pain anticipated   Induction: Intravenous  PONV Risk Score and Plan: 2 and Ondansetron and Dexamethasone  Airway Management Planned: LMA  Additional Equipment:   Intra-op Plan:   Post-operative Plan:   Informed Consent: I have reviewed the patients History and Physical, chart, labs and discussed the procedure including the risks, benefits and alternatives for the proposed anesthesia with the patient or authorized representative who has indicated his/her understanding and acceptance.     Dental Advisory Given  Plan Discussed with: CRNA  Anesthesia Plan Comments:         Anesthesia Quick  Evaluation

## 2023-05-10 NOTE — Transfer of Care (Signed)
Immediate Anesthesia Transfer of Care Note  Patient: Jordan Jones  Procedure(s) Performed: EXCISION MASS, ANAL (Anus)  Patient Location: PACU  Anesthesia Type:General  Level of Consciousness: drowsy  Airway & Oxygen Therapy: Patient Spontanous Breathing  Post-op Assessment: Report given to RN and Post -op Vital signs reviewed and stable  Post vital signs: 140/98Reviewed and stable  Last Vitals:  Vitals Value Taken Time  BP 140/98   Temp 98   Pulse 91   Resp 16   SpO2 100     Last Pain:  Vitals:   05/10/23 0652  PainSc: 0-No pain         Complications: No notable events documented.

## 2023-05-13 LAB — SURGICAL PATHOLOGY

## 2023-05-14 ENCOUNTER — Encounter (HOSPITAL_COMMUNITY): Payer: Self-pay | Admitting: General Surgery

## 2023-05-17 ENCOUNTER — Encounter: Payer: No Typology Code available for payment source | Admitting: General Surgery

## 2023-05-30 ENCOUNTER — Ambulatory Visit: Payer: No Typology Code available for payment source | Admitting: General Surgery

## 2023-05-30 ENCOUNTER — Encounter: Payer: Self-pay | Admitting: General Surgery

## 2023-05-30 VITALS — BP 135/77 | HR 71 | Temp 98.1°F | Resp 16 | Ht 71.0 in | Wt 188.0 lb

## 2023-05-30 DIAGNOSIS — Z09 Encounter for follow-up examination after completed treatment for conditions other than malignant neoplasm: Secondary | ICD-10-CM

## 2023-05-30 DIAGNOSIS — N489 Disorder of penis, unspecified: Secondary | ICD-10-CM

## 2023-05-31 NOTE — Progress Notes (Signed)
Subjective:     Jordan Jones  Patient here for postoperative visit, status post fulguration of perianal condyloma.  He is doing well.  He has no complaints. Objective:    BP 135/77   Pulse 71   Temp 98.1 F (36.7 C) (Oral)   Resp 16   Ht 5\' 11"  (1.803 m)   Wt 188 lb (85.3 kg)   SpO2 96%   BMI 26.22 kg/m   General:  alert, cooperative, and no distress  Perianal region healing very well.  No significant wounds are present.  Photo taken. Final path negative for malignancy.     Assessment:    Doing well postoperatively. Penile lesions    Plan:   Patient will be referred to Dr. Ronne Binning of urology for further evaluation and treatment of the penile lesions.  Follow-up here as needed.

## 2023-07-01 ENCOUNTER — Encounter: Payer: Self-pay | Admitting: Urology

## 2023-07-01 ENCOUNTER — Ambulatory Visit (INDEPENDENT_AMBULATORY_CARE_PROVIDER_SITE_OTHER): Payer: No Typology Code available for payment source | Admitting: Urology

## 2023-07-01 VITALS — BP 121/68 | HR 73

## 2023-07-01 DIAGNOSIS — N489 Disorder of penis, unspecified: Secondary | ICD-10-CM

## 2023-07-01 LAB — URINALYSIS, ROUTINE W REFLEX MICROSCOPIC
Bilirubin, UA: NEGATIVE
Glucose, UA: NEGATIVE
Ketones, UA: NEGATIVE
Leukocytes,UA: NEGATIVE
Nitrite, UA: NEGATIVE
Protein,UA: NEGATIVE
RBC, UA: NEGATIVE
Specific Gravity, UA: 1.025 (ref 1.005–1.030)
Urobilinogen, Ur: 0.2 mg/dL (ref 0.2–1.0)
pH, UA: 6 (ref 5.0–7.5)

## 2023-07-01 LAB — BLADDER SCAN AMB NON-IMAGING: Scan Result: 7

## 2023-07-01 NOTE — Progress Notes (Signed)
post void residual=7 

## 2023-07-01 NOTE — Patient Instructions (Signed)
Genital Warts  Genital warts are a common sexually transmitted infection (STI). They can be easily passed from person to person during sex. They look like small warts near the genitals or the opening of the butt (anus). What are the causes? Genital warts are caused by a virus called human papillomavirus (HPV). You may get HPV if you have sex without a condom with someone who has HPV. What increases the risk? Having sex without using a condom. Having sex with many people. Being a male who is not circumcised. Having sex with a male who is not circumcised. Having a weak body defense system (immune system). What are the signs or symptoms? Small warts near your genitals or butt. These may be: Different sizes and shapes. Flat, round, or look like a cauliflower. The same color as your skin. Tell your doctor if you have: Itching, bleeding, or pain on the skin near your genitals, groin, or butt. Genital warts that look odd, turn into open sores, or change in color. Pain during sex. In many cases, you may not have any symptoms. How is this treated? Medicines, such as creams, that you put on your skin. Procedures to: Freeze the warts. Burn the warts. Surgery to get rid of the warts. If you do not get treatment, the warts may go away, stay the same, or grow in size and number. Follow these instructions at home: Medicines  Apply over-the-counter and prescription medicines only as told by your doctor. Do not use medicines that are meant for treating hand or foot warts. Talk with your doctor about using creams to treat itching. General instructions Do not touch or scratch the warts. Tell your current and past sex partners that you have genital warts. They may need treatment. If you are male, get a Pap and HPV test as often as told by your doctor. If you get pregnant, tell your doctor that you have had genital warts. Keep all follow-up visits. Your doctor will watch you closely. Some types  of HPV make you more likely to get cancer. How is this prevented? To prevent genital warts: Use a new condom each time you have sex. Limit how many people you have sex with. Have a sex partner who does not have other sex partners. Talk with your sex partners about their health. Get the HPV shot. This protects you against the types of HPV that could cause cancer. Where to find more information Centers for Disease Control and Prevention (CDC): [employer]no Contact a doctor if: You have redness, swelling, or pain where your skin is being treated for the warts. You have pain or itching in your groin, genitals, or anus. You feel lumps in or around your genitals or butt. You have bleeding near your genitals or butt. You have pain during sex, or you bleed after sex. This information is not intended to replace advice given to you by your health care provider. Make sure you discuss any questions you have with your health care provider. Document Revised: 02/28/2022 Document Reviewed: 02/28/2022 Elsevier Patient Education  2024 [employer].

## 2023-07-01 NOTE — Progress Notes (Signed)
07/01/2023 8:34 AM   Jordan Jones 04/29/1971 [id number]  Referring provider: Franky Macho, MD [address] [place],  [place] [address]  Penile lesion   HPI: [name] is a 52yo here for evaluation of a penile lesion. His lesions presented 2 years ago. 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 He had lesions removed with dermatology 7 years ago. NO bleeding/draiange from lesions   PMH: Past Medical History:  Diagnosis Date   Anxiety     Surgical History: Past Surgical History:  Procedure Laterality Date   FINGER SURGERY  1990   severed finger left pinky    MASS EXCISION N/A 02/06/2023   Procedure: EXCISION MASS, ANAL;  Surgeon: Jordan Macho, MD;  Location: [facility];  Service: General;  Laterality: N/A;   MASS EXCISION N/A 05/10/2023   Procedure: EXCISION MASS, ANAL;  Surgeon: Jordan Macho, MD;  Location: [facility];  Service: General;  Laterality: N/A;    Home Medications:  Allergies as of 07/01/2023   No Known Allergies      Medication List        Accurate as of July 01, 2023  8:34 AM. If you have any questions, ask your nurse or doctor.          Melatonin 10 MG Tabs Take 10 mg by mouth at bedtime.   One-A-Day Mens 50+ Tabs Take 1 tablet by mouth daily.        Allergies: No Known Allergies  Family History: Family History  Problem Relation Age of Onset   Heart attack Other     Social History:  reports that he has been smoking cigarettes. He has never used smokeless tobacco. He reports that he does not drink alcohol and does not use drugs.  ROS: All other review of systems were reviewed and are negative except what is noted above in HPI  Physical Exam: BP 121/68   Pulse 73   Constitutional:  Alert and oriented, No acute distress. HEENT: [place] AT, moist mucus membranes.  Trachea midline, no masses. Cardiovascular: No clubbing, cyanosis, or edema. Respiratory: Normal respiratory effort, no increased work of  breathing. GI: Abdomen is soft, nontender, nondistended, no abdominal masses GU: No CVA tenderness. Circumcised phallus. No masses/lesions on penis, testis, scrotum. 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 Skin: No rashes, bruises or suspicious lesions. Neurologic: Grossly intact, no focal deficits, moving all 4 extremities. Psychiatric: Normal mood and affect.  Laboratory Data: No results found for: "WBC", "HGB", "HCT", "MCV", "PLT"  No results found for: "CREATININE"  No results found for: "PSA"  No results found for: "TESTOSTERONE"  No results found for: "HGBA1C"  Urinalysis No results found for: "COLORURINE", "APPEARANCEUR", "LABSPEC", "PHURINE", "GLUCOSEU", "HGBUR", "BILIRUBINUR", "KETONESUR", "PROTEINUR", "UROBILINOGEN", "NITRITE", "LEUKOCYTESUR"  No results found for: "LABMICR", "WBCUA", "RBCUA", "LABEPIT", "MUCUS", "BACTERIA"  Pertinent Imaging:  No results found for this or any previous visit.  No results found for this or any previous visit.  No results found for this or any previous visit.  No results found for this or any previous visit.  No results found for this or any previous visit.  No valid procedures specified. No results found for this or any previous visit.  No results found for this or any previous visit.   Assessment & Plan:    1. Penile lesion The risks/benefits/alternatives to penile lesion excision was explained to the patient and he understands and wishes to proceed with surgery -  Urinalysis, Routine w reflex microscopic   No follow-ups on file.  Jordan Aye, MD  [facility] [place]

## 2023-08-06 ENCOUNTER — Encounter (HOSPITAL_COMMUNITY)
Admission: RE | Admit: 2023-08-06 | Discharge: 2023-08-06 | Disposition: A | Discharge status: Home / Self Care | Payer: No Typology Code available for payment source | Source: Ambulatory Visit | Attending: Urology | Admitting: Urology

## 2023-08-06 ENCOUNTER — Encounter (HOSPITAL_COMMUNITY): Payer: Self-pay

## 2023-08-06 DIAGNOSIS — Z01818 Encounter for other preprocedural examination: Secondary | ICD-10-CM | POA: Diagnosis present

## 2023-08-06 NOTE — Patient Instructions (Signed)
 Jordan Jones  08/06/2023     @PREFPERIOPPHARMACY @   Your procedure is scheduled on 08/08/2023.   Report to Jordan Jones at [facility].   Call this number if you have problems the morning of surgery:   [phone]  If you experience any cold or flu symptoms such as cough, fever, chills, shortness of breath, etc. between now and your scheduled surgery, please notify us  at the above number.   Remember:   Do not eat or drink after midnight.   You may drink clear liquids until 10AM .  Clear liquids allowed are:                    Water, Juice (No red color; non-citric and without pulp; diabetics please choose diet or no sugar options), Carbonated beverages (diabetics please choose diet or no sugar options), Clear Tea (No creamer, milk, or cream, including half & half and powdered creamer), and Clear Sports drink (No red color; diabetics please choose diet or no sugar options)    Take these medicines the morning of surgery with A SIP OF WATER : none    Do not wear jewelry, make-up or nail polish, including gel polish,  artificial nails, or any other type of covering on natural nails (fingers and  toes).  Do not wear lotions, powders, or perfumes, or deodorant.  Do not shave 48 hours prior to surgery.  Men may shave face and neck.  Do not bring valuables to the hospital.  [facility] is not responsible for any belongings or valuables.  Contacts, dentures or bridgework may not be worn into surgery.  Leave your suitcase in the car.  After surgery it may be brought to your room.  For patients admitted to the hospital, discharge time will be determined by your treatment team.  Patients discharged the day of surgery will not be allowed to drive home.   Name and phone number of your driver:   Family Special instructions:  N/a  Please read over the following fact sheets that you were given.  Care and Recovery After Surgery  Excision of Skin Lesions Excision of a skin lesion is the  removal of a section of skin by making small incisions in the skin. Through this process, the lesion is completely removed. This procedure is often done to treat or prevent cancer or infection. It may also be done to improve cosmetic appearance. You may have this procedure to remove: Cancerous (malignant) growths, such as basal cell carcinoma, squamous cell carcinoma, or melanoma. Noncancerous (benign) growths, such as a cyst or lipoma. Growths, such as moles or skin tags, which may be removed for cosmetic reasons. Various excision or surgical techniques may be used depending on your condition, the location of the lesion, and your overall health. Tell your health care provider about: Any allergies you have. All medicines you are taking, including vitamins, herbs, eye drops, creams, and over-the-counter medicines. Any problems you or family members have had with anesthetic medicines. Any bleeding problems you have. Any surgeries you have had. Any medical conditions you have. Whether you are pregnant or may be pregnant. What are the risks? Generally, this is a safe procedure. However, problems may occur, including: Bleeding. Infection. Scarring. Recurrence of the cyst, lipoma, or cancer. Allergic reaction to anesthetics, surgical materials, or ointments. Damage to nerves, blood vessels, muscles, or other structures. What happens before the procedure? Medicines Ask your health care provider about: Changing or stopping your regular medicines. This  is especially important if you are taking diabetes medicines or blood thinners. Taking medicines such as aspirin and ibuprofen. These medicines can thin your blood. Do not take these medicines unless your health care provider tells you to take them. Taking over-the-counter medicines, vitamins, herbs, and supplements. General instructions Do not use any products that contain nicotine or tobacco. These products include cigarettes, chewing tobacco,  and vaping devices, such as e-cigarettes. If you need help quitting, ask your health care provider. Follow instructions from your health care provider about eating or drinking restrictions. Ask your health care provider: How your surgery site will be marked. What steps will be taken to help prevent infection. These steps may include: Removing hair at the surgery site. Washing skin with a germ-killing soap. Taking antibiotic medicine. Ask your health care provider if you will need someone to take you home from the hospital or clinic after the procedure. What happens during the procedure?  You will be given a medicine to numb the area (local anesthetic). Your health care provider will remove the lesions using one of the following excision techniques. Complete surgical excision. This procedure may be done to treat a cancerous growth or a noncancerous cyst or lesion. A small scalpel or scissors will be used to gently cut around and under the lesion until it is completely removed. If bleeding occurs, it will be stopped with a device that delivers heat (electrocautery). The edges of the wound may be stitched (sutured) together. A bandage (dressing) will be applied. Samples will be sent to a lab for testing. Excision of a cyst. An incision will be made on the cyst. The entire cyst will be removed through the incision. The incision may be closed with sutures. Shave excision. This may be done to remove a mole or other small growths. A small blade or scalpel will be used to shave off the lesion. The wound is usually left to heal on its own without sutures. The sample may be sent to a lab for testing. Punch excision. This may be done to completely remove a mole or other small growths. A small tool that is like a cookie cutter or a hole punch is used to cut a circle shape out of the skin. The outer edges of the skin will be sutured together. The sample may be sent to a lab for testing. Mohs  micrographic surgery. This is usually done to treat skin cancer. This type of excision is mostly used on the face and ears. This procedure is minimally invasive, and it ensures the best cosmetic outcome. A scalpel or a loop instrument will be used to remove layers of the lesion until all the abnormal or cancerous tissue has been removed. The wound may be sutured, depending on its size. The tissue will be checked under a microscope right away. The procedure may vary among health care providers and hospitals. At the end of any of these procedures, antibiotic ointment will be applied as needed. What happens after the procedure? Talk with your health care provider to discuss any test results, treatment options, and if necessary, the need for more tests. Keep all follow-up visits. This is important. Summary Excision of a skin lesion is the removal of a section of skin by making small incisions in the skin. This procedure is often done to treat or prevent skin cancer, remove benign growths, or it may be done to improve cosmetic appearance. Various excision or surgical techniques may be used depending on your condition, the location  of the lesion, and your overall health. After the procedure, talk with your health care provider to discuss any test results, treatment options, and if necessary, the need for more tests. Keep all follow-up visits. This is important. This information is not intended to replace advice given to you by your health care provider. Make sure you discuss any questions you have with your health care provider. Document Revised: 02/07/2021 Document Reviewed: 02/07/2021 Elsevier Patient Education  2024 [employer]  General Anesthesia, Adult General anesthesia is the use of medicine to make you fall asleep (unconscious) for a medical procedure. General anesthesia must be used for certain procedures. It is often recommended for surgery or procedures that: Last a long time. Require you  to be still or in an unusual position. Are major and can cause blood loss. Affect your breathing. The medicines used for general anesthesia are called general anesthetics. During general anesthesia, these medicines are given along with medicines that: Prevent pain. Control your blood pressure. Relax your muscles. Prevent nausea and vomiting after the procedure. Tell a health care provider about: Any allergies you have. All medicines you are taking, including vitamins, herbs, eye drops, creams, and over-the-counter medicines. Your history of any: Medical conditions you have, including: High blood pressure. Bleeding problems. Diabetes. Heart or lung conditions, such as: Heart failure. Sleep apnea. Asthma. Chronic obstructive pulmonary disease (COPD). Current or recent illnesses, such as: Upper respiratory, chest, or ear infections. Cough or fever. Tobacco or drug use, including marijuana or alcohol use. Depression or anxiety. Surgeries and types of anesthetics you have had. Problems you or family members have had with anesthetic medicines. Whether you are pregnant or may be pregnant. Whether you have any chipped or loose teeth, dentures, caps, bridgework, or issues with your mouth, swallowing, or choking. What are the risks? Your health care provider will talk with you about risks. These may include: Allergic reaction to the medicines. Lung and heart problems. Inhaling food or liquid from the stomach into the lungs (aspiration). Nerve injury. Injury to the lips, mouth, teeth, or gums. Stroke. Waking up during your procedure and being unable to move. This is rare. These problems are more likely to develop if you are having a major surgery or if you have an advanced or serious medical condition. You can prevent some of these complications by answering all of your health care provider's questions thoroughly and by following all instructions before your procedure. General anesthesia  can cause side effects, including: Nausea or vomiting. A sore throat or hoarseness from the breathing tube. Wheezing or coughing. Shaking chills or feeling cold. Body aches. Sleepiness. Confusion, agitation (delirium), or anxiety. What happens before the procedure? When to stop eating and drinking Follow instructions from your health care provider about what you may eat and drink before your procedure. If you do not follow your health care provider's instructions, your procedure may be delayed or canceled. Medicines Ask your health care provider about: Changing or stopping your regular medicines. These include any diabetes medicines or blood thinners you take. Taking medicines such as aspirin and ibuprofen. These medicines can thin your blood. Do not take them unless your health care provider tells you to. Taking over-the-counter medicines, vitamins, herbs, and supplements. General instructions Do not use any products that contain nicotine or tobacco for at least 4 weeks before the procedure. These products include cigarettes, chewing tobacco, and vaping devices, such as e-cigarettes. If you need help quitting, ask your health care provider. If you brush your teeth  on the morning of the procedure, make sure to spit out all of the water and toothpaste. If told by your health care provider, bring your sleep apnea device with you to surgery (if applicable). If you will be going home right after the procedure, plan to have a responsible adult: Take you home from the hospital or clinic. You will not be allowed to drive. Care for you for the time you are told. What happens during the procedure?  An IV will be inserted into one of your veins. You will be given one or more of the following through a face mask or IV: A sedative. This helps you relax. Anesthesia. This will: Numb certain areas of your body. Make you fall asleep for surgery. After you are unconscious, a breathing tube may be  inserted down your throat to help you breathe. This will be removed before you wake up. An anesthesia provider, such as an anesthesiologist, will stay with you throughout your procedure. The anesthesia provider will: Keep you comfortable and safe by continuing to give you medicines and adjusting the amount of medicine that you get. Monitor your blood pressure, heart rate, and oxygen levels to make sure that the anesthetics do not cause any problems. The procedure may vary among health care providers and hospitals. What happens after the procedure? Your blood pressure, temperature, heart rate, breathing rate, and blood oxygen level will be monitored until you leave the hospital or clinic. You will wake up in a recovery area. You may wake up slowly. You may be given medicine to help you with pain, nausea, or any other side effects from the anesthesia. Summary General anesthesia is the use of medicine to make you fall asleep (unconscious) for a medical procedure. Follow your health care provider's instructions about when to stop eating, drinking, or taking certain medicines before your procedure. Plan to have a responsible adult take you home from the hospital or clinic. This information is not intended to replace advice given to you by your health care provider. Make sure you discuss any questions you have with your health care provider. Document Revised: 10/05/2021 Document Reviewed: 10/05/2021 Elsevier Patient Education  2024 Else[employer] Inc.   How to Use Chlorhexidine  Before Surgery Chlorhexidine  gluconate (CHG) is a germ-killing (antiseptic) solution that is used to clean the skin. It can get rid of the bacteria that normally live on the skin and can keep them away for about 24 hours. To clean your skin with CHG, you may be given: A CHG solution to use in the shower or as part of a sponge bath. A prepackaged cloth that contains CHG. Cleaning your skin with CHG may help lower the risk for  infection: While you are staying in the intensive care unit of the hospital. If you have a vascular access, such as a central line, to provide short-term or long-term access to your veins. If you have a catheter to drain urine from your bladder. If you are on a ventilator. A ventilator is a machine that helps you breathe by moving air in and out of your lungs. After surgery. What are the risks? Risks of using CHG include: A skin reaction. Hearing loss, if CHG gets in your ears and you have a perforated eardrum. Eye injury, if CHG gets in your eyes and is not rinsed out. The CHG product catching fire. Make sure that you avoid smoking and flames after applying CHG to your skin. Do not use CHG: If you have a chlorhexidine  allergy  or have previously reacted to chlorhexidine . On babies younger than 23 months of age. How to use CHG solution Use CHG only as told by your health care provider, and follow the instructions on the label. Use the full amount of CHG as directed. Usually, this is one bottle. During a shower Follow these steps when using CHG solution during a shower (unless your health care provider gives you different instructions): Start the shower. Use your normal soap and shampoo to wash your face and hair. Turn off the shower or move out of the shower stream. Pour the CHG onto a clean washcloth. Do not use any type of brush or rough-edged sponge. Starting at your neck, lather your body down to your toes. Make sure you follow these instructions: If you will be having surgery, pay special attention to the part of your body where you will be having surgery. Scrub this area for at least 1 minute. Do not use CHG on your head or face. If the solution gets into your ears or eyes, rinse them well with water. Avoid your genital area. Avoid any areas of skin that have broken skin, cuts, or scrapes. Scrub your back and under your arms. Make sure to wash skin folds. Let the lather sit on your  skin for 1-2 minutes or as long as told by your health care provider. Thoroughly rinse your entire body in the shower. Make sure that all body creases and crevices are rinsed well. Dry off with a clean towel. Do not put any substances on your body afterward--such as powder, lotion, or perfume--unless you are told to do so by your health care provider. Only use lotions that are recommended by the manufacturer. Put on clean clothes or pajamas. If it is the night before your surgery, sleep in clean sheets.  During a sponge bath Follow these steps when using CHG solution during a sponge bath (unless your health care provider gives you different instructions): Use your normal soap and shampoo to wash your face and hair. Pour the CHG onto a clean washcloth. Starting at your neck, lather your body down to your toes. Make sure you follow these instructions: If you will be having surgery, pay special attention to the part of your body where you will be having surgery. Scrub this area for at least 1 minute. Do not use CHG on your head or face. If the solution gets into your ears or eyes, rinse them well with water. Avoid your genital area. Avoid any areas of skin that have broken skin, cuts, or scrapes. Scrub your back and under your arms. Make sure to wash skin folds. Let the lather sit on your skin for 1-2 minutes or as long as told by your health care provider. Using a different clean, wet washcloth, thoroughly rinse your entire body. Make sure that all body creases and crevices are rinsed well. Dry off with a clean towel. Do not put any substances on your body afterward--such as powder, lotion, or perfume--unless you are told to do so by your health care provider. Only use lotions that are recommended by the manufacturer. Put on clean clothes or pajamas. If it is the night before your surgery, sleep in clean sheets. How to use CHG prepackaged cloths Only use CHG cloths as told by your health care  provider, and follow the instructions on the label. Use the CHG cloth on clean, dry skin. Do not use the CHG cloth on your head or face unless your health care  provider tells you to. When washing with the CHG cloth: Avoid your genital area. Avoid any areas of skin that have broken skin, cuts, or scrapes. Before surgery Follow these steps when using a CHG cloth to clean before surgery (unless your health care provider gives you different instructions): Using the CHG cloth, vigorously scrub the part of your body where you will be having surgery. Scrub using a back-and-forth motion for 3 minutes. The area on your body should be completely wet with CHG when you are done scrubbing. Do not rinse. Discard the cloth and let the area air-dry. Do not put any substances on the area afterward, such as powder, lotion, or perfume. Put on clean clothes or pajamas. If it is the night before your surgery, sleep in clean sheets.  For general bathing Follow these steps when using CHG cloths for general bathing (unless your health care provider gives you different instructions). Use a separate CHG cloth for each area of your body. Make sure you wash between any folds of skin and between your fingers and toes. Wash your body in the following order, switching to a new cloth after each step: The front of your neck, shoulders, and chest. Both of your arms, under your arms, and your hands. Your stomach and groin area, avoiding the genitals. Your right leg and foot. Your left leg and foot. The back of your neck, your back, and your buttocks. Do not rinse. Discard the cloth and let the area air-dry. Do not put any substances on your body afterward--such as powder, lotion, or perfume--unless you are told to do so by your health care provider. Only use lotions that are recommended by the manufacturer. Put on clean clothes or pajamas. Contact a health care provider if: Your skin gets irritated after scrubbing. You have  questions about using your solution or cloth. You swallow any chlorhexidine . Call your local poison control center ([phone] in the [place] Get help right away if: Your eyes itch badly, or they become very red or swollen. Your skin itches badly and is red or swollen. Your hearing changes. You have trouble seeing. You have swelling or tingling in your mouth or throat. You have trouble breathing. These symptoms may represent a serious problem that is an emergency. Do not wait to see if the symptoms will go away. Get medical help right away. Call your local emergency services (911 in the [place] Do not drive yourself to the hospital. Summary Chlorhexidine  gluconate (CHG) is a germ-killing (antiseptic) solution that is used to clean the skin. Cleaning your skin with CHG may help to lower your risk for infection. You may be given CHG to use for bathing. It may be in a bottle or in a prepackaged cloth to use on your skin. Carefully follow your health care provider's instructions and the instructions on the product label. Do not use CHG if you have a chlorhexidine  allergy. Contact your health care provider if your skin gets irritated after scrubbing. This information is not intended to replace advice given to you by your health care provider. Make sure you discuss any questions you have with your health care provider. Document Revised: 11/06/2021 Document Reviewed: 09/19/2020 Elsevier Patient Education  2023 [employer].

## 2023-08-08 ENCOUNTER — Encounter (HOSPITAL_COMMUNITY): Payer: Self-pay | Admitting: Urology

## 2023-08-08 ENCOUNTER — Ambulatory Visit (HOSPITAL_BASED_OUTPATIENT_CLINIC_OR_DEPARTMENT_OTHER): Payer: No Typology Code available for payment source | Admitting: Certified Registered"

## 2023-08-08 ENCOUNTER — Other Ambulatory Visit: Payer: Self-pay

## 2023-08-08 ENCOUNTER — Ambulatory Visit (HOSPITAL_COMMUNITY): Payer: No Typology Code available for payment source | Admitting: Certified Registered"

## 2023-08-08 ENCOUNTER — Ambulatory Visit (HOSPITAL_COMMUNITY)
Admission: RE | Admit: 2023-08-08 | Discharge: 2023-08-08 | Disposition: A | Discharge status: Home / Self Care | Payer: No Typology Code available for payment source | Attending: Urology | Admitting: Urology

## 2023-08-08 ENCOUNTER — Encounter (HOSPITAL_COMMUNITY)
Admission: RE | Disposition: A | Discharge status: Home / Self Care | Payer: Self-pay | Source: Home / Self Care | Attending: Urology

## 2023-08-08 DIAGNOSIS — K6282 Dysplasia of anus: Secondary | ICD-10-CM | POA: Diagnosis not present

## 2023-08-08 DIAGNOSIS — N4889 Other specified disorders of penis: Secondary | ICD-10-CM

## 2023-08-08 DIAGNOSIS — L720 Epidermal cyst: Secondary | ICD-10-CM

## 2023-08-08 DIAGNOSIS — F17210 Nicotine dependence, cigarettes, uncomplicated: Secondary | ICD-10-CM | POA: Insufficient documentation

## 2023-08-08 DIAGNOSIS — N509 Disorder of male genital organs, unspecified: Secondary | ICD-10-CM | POA: Diagnosis present

## 2023-08-08 DIAGNOSIS — A630 Anogenital (venereal) warts: Secondary | ICD-10-CM | POA: Diagnosis not present

## 2023-08-08 DIAGNOSIS — N489 Disorder of penis, unspecified: Secondary | ICD-10-CM

## 2023-08-08 HISTORY — PX: LESION DESTRUCTION: SHX5132

## 2023-08-08 SURGERY — DESTRUCTION, LESION, PENIS
Anesthesia: General | Site: Penis

## 2023-08-08 MED ORDER — LACTATED RINGERS IV SOLN: INTRAVENOUS | Status: DC

## 2023-08-08 MED ORDER — OXYCODONE HCL 5 MG/5ML PO SOLN: 5.0000 mg | Freq: Once | ORAL | Status: AC | PRN

## 2023-08-08 MED ORDER — CHLORHEXIDINE GLUCONATE 0.12 % MT SOLN: 15.0000 mL | Freq: Once | OROMUCOSAL | Status: DC

## 2023-08-08 MED ORDER — SODIUM CHLORIDE 0.9 % IV SOLN: 12.5000 mg | INTRAVENOUS | Status: DC | PRN

## 2023-08-08 MED ORDER — ONDANSETRON HCL 4 MG/2ML IJ SOLN
INTRAMUSCULAR | Status: AC
Start: 2023-08-08 — End: ?
  Filled 2023-08-08: qty 2

## 2023-08-08 MED ORDER — OXYCODONE HCL 5 MG PO TABS
5.0000 mg | ORAL_TABLET | Freq: Once | ORAL | Status: AC | PRN
  Administered 2023-08-08: 5 mg via ORAL
  Filled 2023-08-08: qty 1

## 2023-08-08 MED ORDER — MIDAZOLAM HCL 5 MG/5ML IJ SOLN
INTRAMUSCULAR | Status: DC | PRN
  Administered 2023-08-08: 2 mg via INTRAVENOUS

## 2023-08-08 MED ORDER — PROPOFOL 10 MG/ML IV BOLUS
INTRAVENOUS | Status: AC
  Filled 2023-08-08: qty 20

## 2023-08-08 MED ORDER — CEFAZOLIN SODIUM-DEXTROSE 2-4 GM/100ML-% IV SOLN
INTRAVENOUS | Status: AC
  Filled 2023-08-08: qty 100

## 2023-08-08 MED ORDER — FENTANYL CITRATE (PF) 100 MCG/2ML IJ SOLN
INTRAMUSCULAR | Status: AC
  Filled 2023-08-08: qty 2

## 2023-08-08 MED ORDER — CHLORHEXIDINE GLUCONATE 0.12 % MT SOLN
OROMUCOSAL | Status: AC
  Filled 2023-08-08: qty 15

## 2023-08-08 MED ORDER — FENTANYL CITRATE (PF) 100 MCG/2ML IJ SOLN
INTRAMUSCULAR | Status: DC | PRN
  Administered 2023-08-08 (×2): 50 ug via INTRAVENOUS

## 2023-08-08 MED ORDER — 0.9 % SODIUM CHLORIDE (POUR BTL) OPTIME
TOPICAL | Status: DC | PRN
  Administered 2023-08-08: 1000 mL

## 2023-08-08 MED ORDER — LIDOCAINE HCL (PF) 2 % IJ SOLN
INTRAMUSCULAR | Status: DC | PRN
  Administered 2023-08-08: 100 mg via INTRADERMAL

## 2023-08-08 MED ORDER — DEXMEDETOMIDINE HCL IN NACL 80 MCG/20ML IV SOLN
INTRAVENOUS | Status: DC | PRN
  Administered 2023-08-08: 10 ug via INTRAVENOUS

## 2023-08-08 MED ORDER — MIDAZOLAM HCL 2 MG/2ML IJ SOLN
INTRAMUSCULAR | Status: AC
  Filled 2023-08-08: qty 2

## 2023-08-08 MED ORDER — LIDOCAINE HCL (PF) 2 % IJ SOLN
INTRAMUSCULAR | Status: AC
  Filled 2023-08-08: qty 5

## 2023-08-08 MED ORDER — BUPIVACAINE HCL (PF) 0.25 % IJ SOLN
INTRAMUSCULAR | Status: AC
  Filled 2023-08-08: qty 30

## 2023-08-08 MED ORDER — FENTANYL CITRATE PF 50 MCG/ML IJ SOSY: 25.0000 ug | PREFILLED_SYRINGE | INTRAMUSCULAR | Status: DC | PRN

## 2023-08-08 MED ORDER — BUPIVACAINE HCL (PF) 0.25 % IJ SOLN
INTRAMUSCULAR | Status: DC | PRN
  Administered 2023-08-08: 10 mL

## 2023-08-08 MED ORDER — ONDANSETRON HCL 4 MG/2ML IJ SOLN
INTRAMUSCULAR | Status: DC | PRN
  Administered 2023-08-08: 4 mg via INTRAVENOUS

## 2023-08-08 MED ORDER — CEFAZOLIN SODIUM-DEXTROSE 2-4 GM/100ML-% IV SOLN
2.0000 g | INTRAVENOUS | Status: AC
  Administered 2023-08-08: 2 g via INTRAVENOUS

## 2023-08-08 MED ORDER — PROPOFOL 10 MG/ML IV BOLUS
INTRAVENOUS | Status: DC | PRN
  Administered 2023-08-08: 200 mg via INTRAVENOUS

## 2023-08-08 MED ORDER — SODIUM CHLORIDE 0.9 % IV SOLN: INTRAVENOUS | Status: DC | PRN

## 2023-08-08 MED ORDER — OXYCODONE-ACETAMINOPHEN 5-325 MG PO TABS: 1.0000 | ORAL_TABLET | ORAL | 0 refills | Status: DC | PRN

## 2023-08-08 MED ORDER — BACITRACIN ZINC 500 UNIT/GM EX OINT
TOPICAL_OINTMENT | CUTANEOUS | Status: AC
  Filled 2023-08-08: qty 1.8

## 2023-08-08 MED ORDER — ORAL CARE MOUTH RINSE: 15.0000 mL | Freq: Once | OROMUCOSAL | Status: DC

## 2023-08-08 SURGICAL SUPPLY — 33 items
BNDG COHESIVE 2X5 TAN ST LF (GAUZE/BANDAGES/DRESSINGS) IMPLANT
BNDG COHESIVE 2X5 TAN STRL LF (GAUZE/BANDAGES/DRESSINGS) ×2 IMPLANT
BNDG CONFORM 2 STRL LF (GAUZE/BANDAGES/DRESSINGS) ×2 IMPLANT
BNDG STRETCH GAUZE 3IN X12FT (GAUZE/BANDAGES/DRESSINGS) IMPLANT
COVER LIGHT HANDLE STERIS (MISCELLANEOUS) ×4 IMPLANT
DECANTER SPIKE VIAL GLASS SM (MISCELLANEOUS) ×2 IMPLANT
ELECT NDL TIP 2.8 STRL (NEEDLE) ×2 IMPLANT
ELECT NEEDLE TIP 2.8 STRL (NEEDLE) ×1
ELECT REM PT RETURN 9FT ADLT (ELECTROSURGICAL) ×1
ELECTRODE REM PT RTRN 9FT ADLT (ELECTROSURGICAL) ×2 IMPLANT
GAUZE SPONGE 4X4 12PLY STRL (GAUZE/BANDAGES/DRESSINGS) ×2 IMPLANT
GAUZE XEROFORM 1X8 LF (GAUZE/BANDAGES/DRESSINGS) ×2 IMPLANT
GLOVE BIO SURGEON STRL SZ8 (GLOVE) ×2 IMPLANT
GLOVE BIOGEL PI IND STRL 7.0 (GLOVE) ×4 IMPLANT
GOWN STRL REUS W/TWL LRG LVL3 (GOWN DISPOSABLE) ×2 IMPLANT
GOWN STRL REUS W/TWL XL LVL3 (GOWN DISPOSABLE) ×2 IMPLANT
KIT TURNOVER KIT A (KITS) ×2 IMPLANT
MANIFOLD NEPTUNE II (INSTRUMENTS) ×2 IMPLANT
NDL HYPO 25X1 1.5 SAFETY (NEEDLE) ×2 IMPLANT
NEEDLE HYPO 25X1 1.5 SAFETY (NEEDLE) ×1
NS IRRIG 1000ML POUR BTL (IV SOLUTION) ×2 IMPLANT
PACK MINOR (CUSTOM PROCEDURE TRAY) ×2 IMPLANT
PAD ARMBOARD 7.5X6 YLW CONV (MISCELLANEOUS) ×2 IMPLANT
PENCIL SMOKE EVACUATOR (MISCELLANEOUS) ×2 IMPLANT
PLUG CATH AND CAP STER (CATHETERS) ×2 IMPLANT
POSITIONER HEAD 8X9X4 ADT (SOFTGOODS) ×2 IMPLANT
SET BASIN LINEN APH (SET/KITS/TRAYS/PACK) ×2 IMPLANT
SPONGE T-LAP 18X18 ~~LOC~~+RFID (SPONGE) ×2 IMPLANT
SUT ETHIBOND 3 0 (SUTURE) ×2 IMPLANT
SUT VIC AB 3-0 SH 27X BRD (SUTURE) ×2 IMPLANT
SUT VIC AB 4-0 SH 27XBRD (SUTURE) ×4 IMPLANT
SYR CONTROL 10ML LL (SYRINGE) ×2 IMPLANT
TRAY FOLEY W/BAG SLVR 16FR ST (SET/KITS/TRAYS/PACK) ×2 IMPLANT

## 2023-08-08 NOTE — Anesthesia Procedure Notes (Signed)
Procedure Name: LMA Insertion Date/Time: 08/08/2023 1:19 PM  Performed by: Lendon Ka, CRNAPre-anesthesia Checklist: Patient identified, Emergency Drugs available, Suction available and Patient being monitored Patient Re-evaluated:Patient Re-evaluated prior to induction Oxygen Delivery Method: Circle System Utilized Preoxygenation: Pre-oxygenation with 100% oxygen Induction Type: IV induction Ventilation: Mask ventilation without difficulty LMA: LMA inserted LMA Size: 5.0 Number of attempts: 1 Airway Equipment and Method: Bite block Placement Confirmation: positive ETCO2 Tube secured with: Tape Dental Injury: Teeth and Oropharynx as per pre-operative assessment  Comments: With ease

## 2023-08-08 NOTE — Anesthesia Postprocedure Evaluation (Signed)
Anesthesia Post Note  Patient: Jordan Jones  Procedure(s) Performed: LESION DESTRUCTION PENIS (Penis)  Patient location during evaluation: PACU Anesthesia Type: General Level of consciousness: awake and alert Pain management: pain level controlled Vital Signs Assessment: post-procedure vital signs reviewed and stable Respiratory status: spontaneous breathing, nonlabored ventilation, respiratory function stable and patient connected to nasal cannula oxygen Cardiovascular status: blood pressure returned to baseline and stable Postop Assessment: no apparent nausea or vomiting Anesthetic complications: no   There were no known notable events for this encounter.   Last Vitals:  Vitals:   08/08/23 1415 08/08/23 1432  BP: 121/83 121/86  Pulse: 65 95  Resp: 16 18  Temp:  36.6 C  SpO2: 100% 100%    Last Pain:  Vitals:   08/08/23 1432  TempSrc: Oral  PainSc: 3                  Truxton Stupka L Ronav Furney

## 2023-08-08 NOTE — Transfer of Care (Signed)
Immediate Anesthesia Transfer of Care Note  Patient: Jordan Jones  Procedure(s) Performed: LESION DESTRUCTION PENIS (Penis)  Patient Location: PACU  Anesthesia Type:General  Level of Consciousness: awake, alert , oriented, and patient cooperative  Airway & Oxygen Therapy: Patient Spontanous Breathing and Patient connected to nasal cannula oxygen  Post-op Assessment: Report given to RN, Post -op Vital signs reviewed and stable, and Patient moving all extremities  Post vital signs: Reviewed and stable  Last Vitals:  Vitals Value Taken Time  BP  VSS  Temp  VSS  Pulse 62 08/08/23 1356  Resp 13 08/08/23 1356  SpO2 100 % 08/08/23 1356  Vitals shown include unfiled device data.  Last Pain:  Vitals:   08/08/23 1242  TempSrc: Oral  PainSc: 0-No pain      Patients Stated Pain Goal: 5 (08/08/23 1242)  Complications: No notable events documented.

## 2023-08-08 NOTE — Op Note (Signed)
Preoperative diagnosis: penile lesions  Postoperative diagnosis: Same  Procedure: 1. Excision of penile lesions 2. Excision of penile cyst  Attending: Wilkie Aye, MD  Anesthesia: general  History of blood loss: Minimal  Antibiotics: ancef  Drains: none  Specimens: 1. Penile cyst 2. Penile lesions  Findings: 3.5cm penile lesion on right mid penile shaft, 5mm lesion left below coronal ridge. 1cm penile cyst.  Indications: Patient is a 53 year old male with a history of penile lesions. After discussing treatment options he decided to proceed with excision of penile lesions  Procedure in detail: Prior to procedure consent was obtained.  Patient was brought to the operating room and a brief timeout was done to ensure correct patient, correct procedure, correct site.  General anesthesia was administered and patient was placed in supine position.  His genitalia and abdomen was then prepped and draped in usual sterile fashion.  We first made a circumferential incision around the penile lesion at the right mid penile shaft. Using sharp dissection the lesion was removed and sent for pathology. We then removed the penile cyst by making a separate circumferential incision at the distal penile shaft and then using sharp dissection the cyst was removed. We then removed a separate 5mm penile lesion using sharp dissection. Hemostasis was obtained with electrocautery. We then closed the incisions with 3-0 vicryl in a running fashion. Once this was complete we applied a gauze bandage and this then concluded the procedure which was well tolerated by the patient.  Complications: None  Condition: Stable, extubated, transferred to PACU.  Plan: Patient is to be discharged home.  He is to followup in 2 weeks for a wound check

## 2023-08-08 NOTE — Anesthesia Preprocedure Evaluation (Signed)
Anesthesia Evaluation  Patient identified by MRN, date of birth, ID band Patient awake    Reviewed: Allergy & Precautions, H&P , NPO status , Patient's Chart, lab work & pertinent test results, reviewed documented beta blocker date and time   Airway Mallampati: II  TM Distance: >3 FB Neck ROM: full    Dental no notable dental hx. (+) Dental Advisory Given, Chipped   Pulmonary neg pulmonary ROS, Current Smoker and Patient abstained from smoking.   Pulmonary exam normal breath sounds clear to auscultation       Cardiovascular Exercise Tolerance: Good negative cardio ROS Normal cardiovascular exam Rhythm:regular Rate:Normal     Neuro/Psych   Anxiety     negative neurological ROS  negative psych ROS   GI/Hepatic negative GI ROS, Neg liver ROS,,,  Endo/Other  negative endocrine ROS    Renal/GU negative Renal ROS  negative genitourinary   Musculoskeletal negative musculoskeletal ROS (+)    Abdominal Normal abdominal exam  (+)   Peds negative pediatric ROS (+)  Hematology negative hematology ROS (+)   Anesthesia Other Findings   Reproductive/Obstetrics negative OB ROS                             Anesthesia Physical Anesthesia Plan  ASA: 2  Anesthesia Plan: General   Post-op Pain Management: Minimal or no pain anticipated   Induction: Intravenous  PONV Risk Score and Plan: 2 and Ondansetron, Dexamethasone and Midazolam  Airway Management Planned: LMA  Additional Equipment: None  Intra-op Plan:   Post-operative Plan: Extubation in OR  Informed Consent: I have reviewed the patients History and Physical, chart, labs and discussed the procedure including the risks, benefits and alternatives for the proposed anesthesia with the patient or authorized representative who has indicated his/her understanding and acceptance.     Dental Advisory Given  Plan Discussed with:  CRNA  Anesthesia Plan Comments:        Anesthesia Quick Evaluation

## 2023-08-08 NOTE — H&P (Signed)
HPI: [name] is a 53yo here for penile lesion excision. His lesions presented 2 years ago. 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 He had lesions removed with dermatology 7 years ago. NO bleeding/draiange from lesions     PMH:     Past Medical History:  Diagnosis Date   Anxiety            Surgical History:      Past Surgical History:  Procedure Laterality Date   FINGER SURGERY   1990    severed finger left pinky    MASS EXCISION N/A 02/06/2023    Procedure: EXCISION MASS, ANAL;  Surgeon: Franky Macho, MD;  Location: [facility];  Service: General;  Laterality: N/A;   MASS EXCISION N/A 05/10/2023    Procedure: EXCISION MASS, ANAL;  Surgeon: Franky Macho, MD;  Location: [facility];  Service: General;  Laterality: N/A;          Home Medications:  Allergies as of 07/01/2023   No Known Allergies         Medication List           Accurate as of July 01, 2023  8:34 AM. If you have any questions, ask your nurse or doctor.              Melatonin 10 MG Tabs Take 10 mg by mouth at bedtime.    One-A-Day Mens 50+ Tabs Take 1 tablet by mouth daily.             Allergies:  Allergies  No Known Allergies     Family History:      Family History  Problem Relation Age of Onset   Heart attack Other            Social History:  reports that he has been smoking cigarettes. He has never used smokeless tobacco. He reports that he does not drink alcohol and does not use drugs.   ROS: All other review of systems were reviewed and are negative except what is noted above in HPI   Physical Exam: BP 121/68   Pulse 73   Constitutional:  Alert and oriented, No acute distress. HEENT: [place] AT, moist mucus membranes.  Trachea midline, no masses. Cardiovascular: No clubbing, cyanosis, or edema. Respiratory: Normal respiratory effort, no increased work of breathing. GI: Abdomen is soft, nontender, nondistended, no abdominal masses GU: No CVA tenderness.  Circumcised phallus. No masses/lesions on penis, testis, scrotum. 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 Skin: No rashes, bruises or suspicious lesions. Neurologic: Grossly intact, no focal deficits, moving all 4 extremities. Psychiatric: Normal mood and affect.   Laboratory Data: Recent Labs  No results found for: "WBC", "HGB", "HCT", "MCV", "PLT"     Recent Labs  No results found for: "CREATININE"     Recent Labs  No results found for: "PSA"     Recent Labs  No results found for: "TESTOSTERONE"     Recent Labs  No results found for: "HGBA1C"     Urinalysis Labs (Brief)  No results found for: "COLORURINE", "APPEARANCEUR", "LABSPEC", "PHURINE", "GLUCOSEU", "HGBUR", "BILIRUBINUR", "KETONESUR", "PROTEINUR", "UROBILINOGEN", "NITRITE", "LEUKOCYTESUR"     Recent Labs  No results found for: "LABMICR", "WBCUA", "RBCUA", "LABEPIT", "MUCUS", "BACTERIA"     Pertinent Imaging:   No results found for this or any previous visit.   No results found for this or any previous visit.   No results found for this  or any previous visit.   No results found for this or any previous visit.   No results found for this or any previous visit.   No valid procedures specified. No results found for this or any previous visit.   No results found for this or any previous visit.     Assessment & Plan:     1. Penile lesion The risks/benefits/alternatives to penile lesion excision was explained to the patient and he understands and wishes to proceed with surgery

## 2023-08-09 ENCOUNTER — Encounter (HOSPITAL_COMMUNITY): Payer: Self-pay | Admitting: Urology

## 2023-08-12 LAB — SURGICAL PATHOLOGY

## 2023-08-23 ENCOUNTER — Ambulatory Visit (INDEPENDENT_AMBULATORY_CARE_PROVIDER_SITE_OTHER): Payer: No Typology Code available for payment source | Admitting: Urology

## 2023-08-23 VITALS — BP 155/79 | HR 61

## 2023-08-23 DIAGNOSIS — N489 Disorder of penis, unspecified: Secondary | ICD-10-CM

## 2023-08-23 DIAGNOSIS — Z09 Encounter for follow-up examination after completed treatment for conditions other than malignant neoplasm: Secondary | ICD-10-CM

## 2023-08-23 DIAGNOSIS — Z87438 Personal history of other diseases of male genital organs: Secondary | ICD-10-CM

## 2023-08-23 MED ORDER — BACITRACIN 500 UNIT/GM EX OINT: 1.0000 | TOPICAL_OINTMENT | Freq: Two times a day (BID) | CUTANEOUS | 3 refills | Status: DC

## 2023-08-23 NOTE — Progress Notes (Unsigned)
08/23/2023 11:05 AM   Jordan Jones Oct 26, 1970 [id number]  Referring provider: No referring provider defined for this encounter.  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 No drainage from incision.     PMH: Past Medical History:  Diagnosis Date   Anxiety     Surgical History: Past Surgical History:  Procedure Laterality Date   FINGER SURGERY  1990   severed finger left pinky    LESION DESTRUCTION N/A 08/08/2023   Procedure: LESION DESTRUCTION PENIS;  Surgeon: Malen Gauze, MD;  Location: [facility];  Service: Urology;  Laterality: N/A;   MASS EXCISION N/A 02/06/2023   Procedure: EXCISION MASS, ANAL;  Surgeon: Franky Macho, MD;  Location: [facility];  Service: General;  Laterality: N/A;   MASS EXCISION N/A 05/10/2023   Procedure: EXCISION MASS, ANAL;  Surgeon: Franky Macho, MD;  Location: [facility];  Service: General;  Laterality: N/A;    Home Medications:  Allergies as of 08/23/2023   No Known Allergies      Medication List        Accurate as of August 23, 2023 11:05 AM. If you have any questions, ask your nurse or doctor.          ibuprofen 200 MG tablet Commonly known as: ADVIL Take 800 mg by mouth every 8 (eight) hours as needed (pain.).   Melatonin 10 MG Tabs Take 10 mg by mouth at bedtime.   multivitamin with minerals Tabs tablet Take 1 tablet by mouth every evening.   oxyCODONE-acetaminophen 5-325 MG tablet Commonly known as: Percocet Take 1 tablet by mouth every 4 (four) hours as needed for severe pain (pain score 7-10).        Allergies: No Known Allergies  Family History: Family History  Problem Relation Age of Onset   Heart attack Other     Social History:  reports that he has been smoking cigarettes. He has never used smokeless tobacco. He reports that he does not drink alcohol and does not use drugs.  ROS: All other review of systems were reviewed and are negative except what is  noted above in HPI  Physical Exam: BP (!) 155/79   Pulse 61   Constitutional:  Alert and oriented, No acute distress. HEENT: [place] AT, moist mucus membranes.  Trachea midline, no masses. Cardiovascular: No clubbing, cyanosis, or edema. Respiratory: Normal respiratory effort, no increased work of breathing. GI: Abdomen is soft, nontender, nondistended, no abdominal masses GU: No CVA tenderness. Circumcised phallus. No masses/lesions on penis, testis, scrotum. Prostate 40g smooth no nodules no induration.  Lymph: No cervical or inguinal lymphadenopathy. Skin: No rashes, bruises or suspicious lesions. Neurologic: Grossly intact, no focal deficits, moving all 4 extremities. Psychiatric: Normal mood and affect.  Laboratory Data: No results found for: "WBC", "HGB", "HCT", "MCV", "PLT"  No results found for: "CREATININE"  No results found for: "PSA"  No results found for: "TESTOSTERONE"  No results found for: "HGBA1C"  Urinalysis    Component Value Date/Time   APPEARANCEUR Clear 07/01/2023 0823   GLUCOSEU Negative 07/01/2023 0823   BILIRUBINUR Negative 07/01/2023 0823   PROTEINUR Negative 07/01/2023 0823   NITRITE Negative 07/01/2023 0823   LEUKOCYTESUR Negative 07/01/2023 0823    Lab Results  Component Value Date   LABMICR Comment 07/01/2023    Pertinent Imaging: *** No results found for this or any previous visit.  No results found for this or any previous visit.  No results found for this or any  previous visit.  No results found for this or any previous visit.  No results found for this or any previous visit.  No results found for this or any previous visit.  No results found for this or any previous visit.  No results found for this or any previous visit.   Assessment & Plan:    1. Penile lesion (Primary) Bacitracin BID for 3-4 weeks    No follow-ups on file.  Wilkie Aye, MD  [facility] [place]

## 2023-08-27 ENCOUNTER — Encounter: Payer: Self-pay | Admitting: Urology

## 2023-09-25 ENCOUNTER — Encounter: Payer: No Typology Code available for payment source | Admitting: Urology

## 2023-10-14 ENCOUNTER — Ambulatory Visit: Payer: No Typology Code available for payment source | Admitting: Urology

## 2023-10-14 VITALS — BP 148/72 | HR 88

## 2023-10-14 DIAGNOSIS — N489 Disorder of penis, unspecified: Secondary | ICD-10-CM

## 2023-10-14 LAB — URINALYSIS, ROUTINE W REFLEX MICROSCOPIC
Bilirubin, UA: NEGATIVE
Glucose, UA: NEGATIVE
Ketones, UA: NEGATIVE
Leukocytes,UA: NEGATIVE
Nitrite, UA: NEGATIVE
Protein,UA: NEGATIVE
RBC, UA: NEGATIVE
Specific Gravity, UA: 1.015 (ref 1.005–1.030)
Urobilinogen, Ur: 0.2 mg/dL (ref 0.2–1.0)
pH, UA: 7.5 (ref 5.0–7.5)

## 2023-10-14 NOTE — Progress Notes (Signed)
 10/14/2023 10:38 AM   Gaye Pollack [place] 30-Aug-1970 [id number]  Referring provider: No referring provider defined for this encounter.  Followup penile lesion   HPI: [name] is a 52yo here for followup after penile lesion removal. Incision healed well. No drainage from incision. No penile pain   PMH: Past Medical History:  Diagnosis Date   Anxiety     Surgical History: Past Surgical History:  Procedure Laterality Date   FINGER SURGERY  1990   severed finger left pinky    LESION DESTRUCTION N/A 08/08/2023   Procedure: LESION DESTRUCTION PENIS;  Surgeon: Malen Gauze, MD;  Location: [facility];  Service: Urology;  Laterality: N/A;   MASS EXCISION N/A 02/06/2023   Procedure: EXCISION MASS, ANAL;  Surgeon: Franky Macho, MD;  Location: [facility];  Service: General;  Laterality: N/A;   MASS EXCISION N/A 05/10/2023   Procedure: EXCISION MASS, ANAL;  Surgeon: Franky Macho, MD;  Location: [facility];  Service: General;  Laterality: N/A;    Home Medications:  Allergies as of 10/14/2023   No Known Allergies      Medication List        Accurate as of October 14, 2023 10:38 AM. If you have any questions, ask your nurse or doctor.          bacitracin 500 UNIT/GM ointment Apply 1 Application topically 2 (two) times daily.   ibuprofen 200 MG tablet Commonly known as: ADVIL Take 800 mg by mouth every 8 (eight) hours as needed (pain.).   Melatonin 10 MG Tabs Take 10 mg by mouth at bedtime.   multivitamin with minerals Tabs tablet Take 1 tablet by mouth every evening.   oxyCODONE-acetaminophen 5-325 MG tablet Commonly known as: Percocet Take 1 tablet by mouth every 4 (four) hours as needed for severe pain (pain score 7-10).        Allergies: No Known Allergies  Family History: Family History  Problem Relation Age of Onset   Heart attack Other     Social History:  reports that he has been smoking cigarettes. He has never used smokeless tobacco. He reports that he  does not drink alcohol and does not use drugs.  ROS: All other review of systems were reviewed and are negative except what is noted above in HPI  Physical Exam: BP (!) 148/72   Pulse 88   Constitutional:  Alert and oriented, No acute distress. HEENT: [place] AT, moist mucus membranes.  Trachea midline, no masses. Cardiovascular: No clubbing, cyanosis, or edema. Respiratory: Normal respiratory effort, no increased work of breathing. GI: Abdomen is soft, nontender, nondistended, no abdominal masses GU: No CVA tenderness.  Lymph: No cervical or inguinal lymphadenopathy. Skin: No rashes, bruises or suspicious lesions. Neurologic: Grossly intact, no focal deficits, moving all 4 extremities. Psychiatric: Normal mood and affect.  Laboratory Data: No results found for: "WBC", "HGB", "HCT", "MCV", "PLT"  No results found for: "CREATININE"  No results found for: "PSA"  No results found for: "TESTOSTERONE"  No results found for: "HGBA1C"  Urinalysis    Component Value Date/Time   APPEARANCEUR Clear 07/01/2023 0823   GLUCOSEU Negative 07/01/2023 0823   BILIRUBINUR Negative 07/01/2023 0823   PROTEINUR Negative 07/01/2023 0823   NITRITE Negative 07/01/2023 0823   LEUKOCYTESUR Negative 07/01/2023 0823    Lab Results  Component Value Date   LABMICR Comment 07/01/2023    Pertinent Imaging:  No results found for this or any previous visit.  No results found for this or any previous  visit.  No results found for this or any previous visit.  No results found for this or any previous visit.  No results found for this or any previous visit.  No results found for this or any previous visit.  No results found for this or any previous visit.  No results found for this or any previous visit.   Assessment & Plan:    1. Penile lesion (Primary) Incision healed well. Followup prn - Urinalysis, Routine w reflex microscopic   No follow-ups on file.  Wilkie Aye, MD  [facility] [place]

## 2023-12-04 ENCOUNTER — Other Ambulatory Visit (HOSPITAL_COMMUNITY): Payer: Self-pay | Admitting: Internal Medicine

## 2023-12-04 DIAGNOSIS — F17210 Nicotine dependence, cigarettes, uncomplicated: Secondary | ICD-10-CM

## 2023-12-21 ENCOUNTER — Ambulatory Visit (HOSPITAL_COMMUNITY)
Admission: RE | Admit: 2023-12-21 | Discharge: 2023-12-21 | Disposition: A | Discharge status: Home / Self Care | Source: Ambulatory Visit | Attending: Internal Medicine | Admitting: Internal Medicine

## 2023-12-21 DIAGNOSIS — F17210 Nicotine dependence, cigarettes, uncomplicated: Secondary | ICD-10-CM | POA: Diagnosis present

## 2024-01-10 ENCOUNTER — Other Ambulatory Visit: Payer: Self-pay | Admitting: *Deleted

## 2024-01-10 DIAGNOSIS — Z1211 Encounter for screening for malignant neoplasm of colon: Secondary | ICD-10-CM

## 2024-01-16 ENCOUNTER — Encounter: Payer: Self-pay | Admitting: General Surgery

## 2024-01-16 ENCOUNTER — Ambulatory Visit (INDEPENDENT_AMBULATORY_CARE_PROVIDER_SITE_OTHER): Admitting: General Surgery

## 2024-01-16 VITALS — BP 118/75 | HR 72 | Temp 98.0°F | Resp 16 | Ht 71.0 in | Wt 173.0 lb

## 2024-01-16 DIAGNOSIS — Z1211 Encounter for screening for malignant neoplasm of colon: Secondary | ICD-10-CM

## 2024-01-16 MED ORDER — SUTAB 1479-225-188 MG PO TABS: 24.0000 | ORAL_TABLET | Freq: Once | ORAL | 0 refills | Status: AC

## 2024-01-16 NOTE — Addendum Note (Signed)
 Addended by: SAUNDRA TAWNI DEL on: 01/16/2024 02:42 PM   Modules accepted: Orders

## 2024-01-16 NOTE — H&P (Signed)
 Jordan Jones; [id number]; 1971-06-21   HPI Patient is a 53 year old white male who returns to my care for a screening colonoscopy.  He has never had a colonoscopy.  He denies any family history of colon cancer.  He denies any abnormal weight loss, blood per rectum, constipation, or diarrhea.  He states he has healed well from the excision of his condylomas. Past Medical History:  Diagnosis Date   Anxiety     Past Surgical History:  Procedure Laterality Date   FINGER SURGERY  1990   severed finger left pinky    LESION DESTRUCTION N/A 08/08/2023   Procedure: LESION DESTRUCTION PENIS;  Surgeon: Sherrilee Belvie CROME, MD;  Location: [facility];  Service: Urology;  Laterality: N/A;   MASS EXCISION N/A 02/06/2023   Procedure: EXCISION MASS, ANAL;  Surgeon: Mavis Anes, MD;  Location: [facility];  Service: General;  Laterality: N/A;   MASS EXCISION N/A 05/10/2023   Procedure: EXCISION MASS, ANAL;  Surgeon: Mavis Anes, MD;  Location: [facility];  Service: General;  Laterality: N/A;    Family History  Problem Relation Age of Onset   Heart attack Other     Current Outpatient Medications on File Prior to Visit  Medication Sig Dispense Refill   ibuprofen (ADVIL) 200 MG tablet Take 800 mg by mouth every 8 (eight) hours as needed (pain.).     Melatonin 10 MG TABS Take 10 mg by mouth at bedtime.     Multiple Vitamin (MULTIVITAMIN WITH MINERALS) TABS tablet Take 1 tablet by mouth every evening.     No current facility-administered medications on file prior to visit.    No Known Allergies  Social History   Substance and Sexual Activity  Alcohol Use Never    Social History   Tobacco Use  Smoking Status Every Day   Types: Cigars  Smokeless Tobacco Never    Review of Systems  Constitutional: Negative.   HENT: Negative.    Eyes:  Positive for blurred vision.  Respiratory: Negative.    Cardiovascular: Negative.   Gastrointestinal: Negative.   Genitourinary: Negative.   Musculoskeletal:  Negative.   Skin: Negative.   Neurological: Negative.   Endo/Heme/Allergies: Negative.   Psychiatric/Behavioral: Negative.      Objective   Vitals:   01/16/24 0849  BP: 118/75  Pulse: 72  Resp: 16  Temp: 98 F (36.7 C)  SpO2: 96%    Physical Exam Vitals reviewed.  Constitutional:      Appearance: Normal appearance. He is normal weight. He is not ill-appearing.  HENT:     Head: Normocephalic and atraumatic.   Cardiovascular:     Rate and Rhythm: Normal rate and regular rhythm.     Heart sounds: Normal heart sounds. No murmur heard.    No friction rub. No gallop.  Pulmonary:     Effort: Pulmonary effort is normal. No respiratory distress.     Breath sounds: Normal breath sounds. No stridor. No wheezing, rhonchi or rales.  Abdominal:     General: There is no distension.     Palpations: Abdomen is soft. There is no mass.     Tenderness: There is no abdominal tenderness. There is no guarding or rebound.     Hernia: No hernia is present.   Skin:    General: Skin is warm and dry.   Neurological:     Mental Status: He is alert and oriented to person, place, and time.     Assessment  Need for screening colonoscopy Plan  Patient is scheduled for screening colonoscopy on 02/04/2024.  The risks and benefits of the procedure including bleeding and perforation were fully explained to the patient, who gave informed consent.  I will also evaluate his anal canal for any condyloma.  Sutabs have been prescribed preoperatively for bowel preparation.

## 2024-01-16 NOTE — Progress Notes (Signed)
 Jordan Jones; [id number]; 1971-06-21   HPI Patient is a 53 year old white male who returns to my care for a screening colonoscopy.  He has never had a colonoscopy.  He denies any family history of colon cancer.  He denies any abnormal weight loss, blood per rectum, constipation, or diarrhea.  He states he has healed well from the excision of his condylomas. Past Medical History:  Diagnosis Date   Anxiety     Past Surgical History:  Procedure Laterality Date   FINGER SURGERY  1990   severed finger left pinky    LESION DESTRUCTION N/A 08/08/2023   Procedure: LESION DESTRUCTION PENIS;  Surgeon: Sherrilee Belvie CROME, MD;  Location: [facility];  Service: Urology;  Laterality: N/A;   MASS EXCISION N/A 02/06/2023   Procedure: EXCISION MASS, ANAL;  Surgeon: Mavis Anes, MD;  Location: [facility];  Service: General;  Laterality: N/A;   MASS EXCISION N/A 05/10/2023   Procedure: EXCISION MASS, ANAL;  Surgeon: Mavis Anes, MD;  Location: [facility];  Service: General;  Laterality: N/A;    Family History  Problem Relation Age of Onset   Heart attack Other     Current Outpatient Medications on File Prior to Visit  Medication Sig Dispense Refill   ibuprofen (ADVIL) 200 MG tablet Take 800 mg by mouth every 8 (eight) hours as needed (pain.).     Melatonin 10 MG TABS Take 10 mg by mouth at bedtime.     Multiple Vitamin (MULTIVITAMIN WITH MINERALS) TABS tablet Take 1 tablet by mouth every evening.     No current facility-administered medications on file prior to visit.    No Known Allergies  Social History   Substance and Sexual Activity  Alcohol Use Never    Social History   Tobacco Use  Smoking Status Every Day   Types: Cigars  Smokeless Tobacco Never    Review of Systems  Constitutional: Negative.   HENT: Negative.    Eyes:  Positive for blurred vision.  Respiratory: Negative.    Cardiovascular: Negative.   Gastrointestinal: Negative.   Genitourinary: Negative.   Musculoskeletal:  Negative.   Skin: Negative.   Neurological: Negative.   Endo/Heme/Allergies: Negative.   Psychiatric/Behavioral: Negative.      Objective   Vitals:   01/16/24 0849  BP: 118/75  Pulse: 72  Resp: 16  Temp: 98 F (36.7 C)  SpO2: 96%    Physical Exam Vitals reviewed.  Constitutional:      Appearance: Normal appearance. He is normal weight. He is not ill-appearing.  HENT:     Head: Normocephalic and atraumatic.   Cardiovascular:     Rate and Rhythm: Normal rate and regular rhythm.     Heart sounds: Normal heart sounds. No murmur heard.    No friction rub. No gallop.  Pulmonary:     Effort: Pulmonary effort is normal. No respiratory distress.     Breath sounds: Normal breath sounds. No stridor. No wheezing, rhonchi or rales.  Abdominal:     General: There is no distension.     Palpations: Abdomen is soft. There is no mass.     Tenderness: There is no abdominal tenderness. There is no guarding or rebound.     Hernia: No hernia is present.   Skin:    General: Skin is warm and dry.   Neurological:     Mental Status: He is alert and oriented to person, place, and time.     Assessment  Need for screening colonoscopy Plan  Patient is scheduled for screening colonoscopy on 02/04/2024.  The risks and benefits of the procedure including bleeding and perforation were fully explained to the patient, who gave informed consent.  I will also evaluate his anal canal for any condyloma.  Sutabs have been prescribed preoperatively for bowel preparation.

## 2024-02-03 NOTE — Anesthesia Preprocedure Evaluation (Signed)
 Anesthesia Evaluation  Patient identified by MRN, date of birth, ID band Patient awake    Reviewed: Allergy & Precautions, H&P , NPO status , Patient's Chart, lab work & pertinent test results, reviewed documented beta blocker date and time   Airway Mallampati: II  TM Distance: >3 FB Neck ROM: full    Dental no notable dental hx. (+) Dental Advisory Given, Chipped   Pulmonary Current Smoker and Patient abstained from smoking.   Pulmonary exam normal breath sounds clear to auscultation       Cardiovascular Exercise Tolerance: Good negative cardio ROS Normal cardiovascular exam Rhythm:regular Rate:Normal     Neuro/Psych   Anxiety     negative neurological ROS  negative psych ROS   GI/Hepatic negative GI ROS, Neg liver ROS,,,  Endo/Other  negative endocrine ROS    Renal/GU negative Renal ROS  negative genitourinary   Musculoskeletal negative musculoskeletal ROS (+)    Abdominal Normal abdominal exam  (+)   Peds negative pediatric ROS (+)  Hematology negative hematology ROS (+)   Anesthesia Other Findings   Reproductive/Obstetrics negative OB ROS                              Anesthesia Physical Anesthesia Plan  ASA: 2  Anesthesia Plan: General   Post-op Pain Management: Minimal or no pain anticipated   Induction: Intravenous  PONV Risk Score and Plan: 2 and Propofol  infusion  Airway Management Planned: Nasal Cannula and Natural Airway  Additional Equipment: None  Intra-op Plan:   Post-operative Plan:   Informed Consent: I have reviewed the patients History and Physical, chart, labs and discussed the procedure including the risks, benefits and alternatives for the proposed anesthesia with the patient or authorized representative who has indicated his/her understanding and acceptance.     Dental Advisory Given  Plan Discussed with: CRNA  Anesthesia Plan Comments:          Anesthesia Quick Evaluation

## 2024-02-04 ENCOUNTER — Other Ambulatory Visit: Payer: Self-pay

## 2024-02-04 ENCOUNTER — Encounter (HOSPITAL_COMMUNITY)
Admission: RE | Disposition: A | Discharge status: Home / Self Care | Payer: Self-pay | Source: Home / Self Care | Attending: General Surgery

## 2024-02-04 ENCOUNTER — Encounter (HOSPITAL_COMMUNITY): Payer: Self-pay | Admitting: Anesthesiology

## 2024-02-04 ENCOUNTER — Encounter (HOSPITAL_COMMUNITY): Payer: Self-pay | Admitting: General Surgery

## 2024-02-04 ENCOUNTER — Ambulatory Visit (HOSPITAL_COMMUNITY)
Admission: RE | Admit: 2024-02-04 | Discharge: 2024-02-04 | Disposition: A | Discharge status: Home / Self Care | Attending: General Surgery | Admitting: General Surgery

## 2024-02-04 ENCOUNTER — Ambulatory Visit (HOSPITAL_BASED_OUTPATIENT_CLINIC_OR_DEPARTMENT_OTHER): Payer: Self-pay | Admitting: Anesthesiology

## 2024-02-04 DIAGNOSIS — Z1211 Encounter for screening for malignant neoplasm of colon: Secondary | ICD-10-CM

## 2024-02-04 DIAGNOSIS — F17290 Nicotine dependence, other tobacco product, uncomplicated: Secondary | ICD-10-CM | POA: Diagnosis not present

## 2024-02-04 DIAGNOSIS — D125 Benign neoplasm of sigmoid colon: Secondary | ICD-10-CM | POA: Diagnosis not present

## 2024-02-04 HISTORY — PX: COLONOSCOPY: SHX5424

## 2024-02-04 SURGERY — COLONOSCOPY
Anesthesia: General

## 2024-02-04 MED ORDER — PROPOFOL 1000 MG/100ML IV EMUL
INTRAVENOUS | Status: AC
Start: 2024-02-04 — End: 2024-02-04
  Filled 2024-02-04: qty 100

## 2024-02-04 MED ORDER — LIDOCAINE 2% (20 MG/ML) 5 ML SYRINGE
INTRAMUSCULAR | Status: DC | PRN
  Administered 2024-02-04: 100 mg via INTRAVENOUS

## 2024-02-04 MED ORDER — LACTATED RINGERS IV SOLN: INTRAVENOUS | Status: DC

## 2024-02-04 MED ORDER — PROPOFOL 500 MG/50ML IV EMUL
INTRAVENOUS | Status: DC | PRN
  Administered 2024-02-04: 50 mg via INTRAVENOUS
  Administered 2024-02-04: 100 ug/kg/min via INTRAVENOUS

## 2024-02-04 MED ORDER — LIDOCAINE 2% (20 MG/ML) 5 ML SYRINGE
INTRAMUSCULAR | Status: AC
  Filled 2024-02-04: qty 5

## 2024-02-04 MED ORDER — GLYCOPYRROLATE PF 0.2 MG/ML IJ SOSY
PREFILLED_SYRINGE | INTRAMUSCULAR | Status: DC | PRN
  Administered 2024-02-04: .2 mg via INTRAVENOUS

## 2024-02-04 MED ORDER — CHLORHEXIDINE GLUCONATE CLOTH 2 % EX PADS: 6.0000 | MEDICATED_PAD | Freq: Once | CUTANEOUS | Status: DC

## 2024-02-04 MED ORDER — SODIUM CHLORIDE 0.9% FLUSH: 3.0000 mL | INTRAVENOUS | Status: DC | PRN

## 2024-02-04 MED ORDER — SODIUM CHLORIDE 0.9% FLUSH: 3.0000 mL | Freq: Two times a day (BID) | INTRAVENOUS | Status: DC

## 2024-02-04 MED ORDER — GLYCOPYRROLATE PF 0.2 MG/ML IJ SOSY
PREFILLED_SYRINGE | INTRAMUSCULAR | Status: AC
  Filled 2024-02-04: qty 1

## 2024-02-04 NOTE — Op Note (Signed)
 [facility] Patient Name: Jordan Jones Procedure Date: 02/04/2024 7:02 AM MRN: [id number] Date of Birth: [birth year] Attending MD: [name] , MD, [id number] CSN: [id number] Age: 53 Admit Type: Outpatient Procedure:                Colonoscopy Indications:              Screening for colorectal malignant neoplasm Providers:                [name], MD, Devere Lodge, Jon Loge Referring MD:              Medicines:                Propofol  per Anesthesia Complications:            No immediate complications. Estimated Blood Loss:     Estimated blood loss: none. Procedure:                Pre-Anesthesia Assessment:                           - Prior to the procedure, a History and Physical                            was performed, and patient medications and                            allergies were reviewed. The patient is competent.                            The risks and benefits of the procedure and the                            sedation options and risks were discussed with the                            patient. All questions were answered and informed                            consent was obtained. Patient identification and                            proposed procedure were verified by the physician,                            the nurse, the anesthesiologist, the anesthetist                            and the technician in the endoscopy suite. Mental                            Status Examination: alert and oriented. Airway                            Examination: normal oropharyngeal airway and neck  mobility. Respiratory Examination: clear to                            auscultation. CV Examination: RRR, no murmurs, no                            S3 or S4. Prophylactic Antibiotics: The patient                            does not require prophylactic antibiotics. Prior                            Anticoagulants: The patient has taken no                             anticoagulant or antiplatelet agents. ASA Grade                            Assessment: II - A patient with mild systemic                            disease. After reviewing the risks and benefits,                            the patient was deemed in satisfactory condition to                            undergo the procedure. The anesthesia plan was to                            use deep sedation / analgesia. Immediately prior to                            administration of medications, the patient was                            re-assessed for adequacy to receive sedatives. The                            heart rate, respiratory rate, oxygen saturations,                            blood pressure, adequacy of pulmonary ventilation,                            and response to care were monitored throughout the                            procedure. The physical status of the patient was                            re-assessed after the procedure.  After obtaining informed consent, the colonoscope                            was passed under direct vision. Throughout the                            procedure, the patient's blood pressure, pulse, and                            oxygen saturations were monitored continuously. The                            [id number]) scope was introduced through the                            anus and advanced to the the cecum, identified by                            the appendiceal orifice, ileocecal valve and                            palpation. The ileocecal valve was photographed.                            The entire colon was well visualized. The                            colonoscopy was performed without difficulty. The                            quality of the bowel preparation was adequate. The                            total duration of the procedure was 14 minutes. Scope In: 7:22:53 AM Scope Out:  7:40:33 AM Scope Withdrawal Time: 0 hours 7 minutes 35 seconds  Total Procedure Duration: 0 hours 17 minutes 40 seconds  Findings:      The perianal and digital rectal examinations were normal.      A 2 mm polyp was found in the mid sigmoid colon. The polyp was sessile.       The polyp was removed with a cold snare. Resection and retrieval were       complete. Estimated blood loss: none.      A 5 mm polyp was found in the distal sigmoid colon. The polyp was       pedunculated. The polyp was removed with a hot snare. Resection and       retrieval were complete. Estimated blood loss: none.      The exam was otherwise without abnormality. Impression:               - One 2 mm polyp in the mid sigmoid colon, removed                            with a cold snare. Resected and retrieved.                           -  One 5 mm polyp in the distal sigmoid colon,                            removed with a hot snare. Resected and retrieved.                           - The examination was otherwise normal. Moderate Sedation:      Moderate (conscious) sedation was administered by the nurse and       supervised by the endoscopist. The patient's oxygen saturation, heart       rate, blood pressure and response to care were monitored. Recommendation:           - Written discharge instructions were provided to                            the patient.                           - The signs and symptoms of potential delayed                            complications were discussed with the patient.                           - Patient has a contact number available for                            emergencies.                           - Return to normal activities tomorrow.                           - Resume previous diet.                           - Continue present medications.                           - Repeat colonoscopy is recommended for                            surveillance. The colonoscopy date will be                             determined after pathology results from today's                            exam become available for review. Procedure Code(s):        --- Professional ---                           [phone], Colonoscopy, flexible; with removal of                            tumor(s), polyp(s), or other lesion(s) by snare  technique Diagnosis Code(s):        --- Professional ---                           Z12.11, Encounter for screening for malignant                            neoplasm of colon                           D12.5, Benign neoplasm of sigmoid colon CPT copyright 2022 [employer]. All rights reserved. The codes documented in this report are preliminary and upon coder review may  be revised to meet current compliance requirements. [name], MD [name], MD 02/04/2024 7:46:26 AM This report has been signed electronically. Number of Addenda: 0

## 2024-02-04 NOTE — Anesthesia Postprocedure Evaluation (Signed)
 Anesthesia Post Note  Patient: Jordan Jones  Procedure(s) Performed: COLONOSCOPY  Patient location during evaluation: Endoscopy Anesthesia Type: General Level of consciousness: awake and alert Pain management: pain level controlled Vital Signs Assessment: post-procedure vital signs reviewed and stable Respiratory status: spontaneous breathing, nonlabored ventilation and respiratory function stable Cardiovascular status: blood pressure returned to baseline and stable Postop Assessment: no apparent nausea or vomiting Anesthetic complications: no   There were no known notable events for this encounter.   Last Vitals:  Vitals:   02/04/24 0752 02/04/24 0757  BP: (!) 89/52 103/70  Pulse: 65 67  Resp: 19 20  Temp:    SpO2: 99% 99%    Last Pain:  Vitals:   02/04/24 0757  TempSrc:   PainSc: 0-No pain                 [place] Throckmorton L Sherylann Vangorden

## 2024-02-04 NOTE — Interval H&P Note (Signed)
 History and Physical Interval Note:  02/04/2024 7:13 AM  Jordan Jones  has presented today for surgery, with the diagnosis of SCREENING FOR COLON CANCER.  The various methods of treatment have been discussed with the patient and family. After consideration of risks, benefits and other options for treatment, the patient has consented to  Procedure(s) with comments: COLONOSCOPY (N/A) - W/ PROPOFOL  as a surgical intervention.  The patient's history has been reviewed, patient examined, no change in status, stable for surgery.  I have reviewed the patient's chart and labs.  Questions were answered to the patient's satisfaction.     [name]

## 2024-02-04 NOTE — Transfer of Care (Signed)
 Immediate Anesthesia Transfer of Care Note  Patient: Jordan Jones  Procedure(s) Performed: COLONOSCOPY  Patient Location: Endoscopy Unit  Anesthesia Type:General  Level of Consciousness: drowsy  Airway & Oxygen Therapy: Patient Spontanous Breathing  Post-op Assessment: Report given to RN and Post -op Vital signs reviewed and stable  Post vital signs: Reviewed and stable  Last Vitals:  Vitals Value Taken Time  BP 84/48 02/04/24 07:47  Temp 36.4 C 02/04/24 07:47  Pulse 65 02/04/24 07:47  Resp 13 02/04/24 07:47  SpO2 97 % 02/04/24 07:47    Last Pain:  Vitals:   02/04/24 0747  TempSrc: Oral  PainSc:          Complications: No notable events documented.

## 2024-02-05 LAB — SURGICAL PATHOLOGY

## 2024-02-06 ENCOUNTER — Encounter (HOSPITAL_COMMUNITY): Payer: Self-pay | Admitting: General Surgery

## 2024-02-18 ENCOUNTER — Encounter: Payer: Self-pay | Admitting: General Surgery

## 2024-02-18 ENCOUNTER — Ambulatory Visit: Admitting: General Surgery

## 2024-02-18 DIAGNOSIS — Z09 Encounter for follow-up examination after completed treatment for conditions other than malignant neoplasm: Secondary | ICD-10-CM

## 2024-02-18 NOTE — Progress Notes (Signed)
 Postoperative virtual telephone visit performed with patient.  I was in the hospital and he was at home.  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  I did not see any anal condyloma.  He was instructed to return in 5 years for follow-up colonoscopy.  He understands and agrees.    Results Surgical pathology (Order [id number]) MyChart Results Release  MyChart Status: Active  Results Release   Surgical pathology Order: [id number]  Status: Edited Result - FINAL     Next appt: None   Test Result Released: Yes (seen)   0 Result Notes    Component Ref Range & Units (hover) 2 wk ago  SURGICAL PATHOLOGY SURGICAL PATHOLOGY CASE: [id number] PATIENT: [facility] Surgical Pathology Report     Clinical History: screening for colon cancer (tb)     FINAL MICROSCOPIC DIAGNOSIS:  A. COLON SIGMOID POLYPECTOMY: - Tubular adenomas (2). - Visualized margin is negative for adenoma.  GROSS DESCRIPTION:  The specimen is received in formalin and consists of 2 pieces of tan to tan-red soft tissue, measuring 0.4 cm and 1.0 x 0.7 x 0.6 cm.  The possible resection margin on the largest piece of tissue is inked black, and the piece is bisected.  The specimen is entirely submitted in 1 cassette.  SHIRLEEN 02/04/2024)    Final Diagnosis performed by Wally Highland, MD.   Electronically signed 02/05/2024 Technical and / or Professional components performed at [facility], [address] [place], [place] [address].  Immunohistochemistry Technical component (if applicable) was performed at [facility]. [address], [place], [place] [address].   IMMUNOHISTOCHEMISTRY DISCLAIMER (if applicable): Some of these immunohistochemical stains may have been developed and the performance characteristics determine by [facility]. Some may not have been cleared or approved by the U.S. Food  and Drug Administration. The FDA has determined that such clearance or approval is not necessary. This test is used for clinical purposes. It should not be regarded as investigational or for research. This laboratory is certified under the Clinical Laboratory Improvement Amendments of 1988 (CLIA-88) as qualified to perform high complexity clinical laboratory testing.  The controls stained appropriately.   IHC stains are performed on formalin fixed, paraffin embedded tissue using a 3,3diaminobenzidine (DAB) chromogen and Leica Bond Autostainer System. The staining intensity of the nucleus is score manually and is reported as the percentage of tumor cell nuclei demonstrating specific nuclear staining. The specimens are fixed in 10% Neutral Formalin for at least 6 hours and up to 72hrs. These tests are validated on decalcified tissue. Results should be interpreted with caution given the possibility of false negative results on decalcified specimens. Antibody Clones are as follows ER-clone [id number], PR-clone 16, Ki67- clone MM1. Some of these immunohistochemical stains may have been developed and the performance characteristics determined by [facility].  Resulting Agency [facility]        Specimen Collected: 02/04/24 07:36 Last Resulted: 02/05/24 15:42    Total telephone time was 2 minutes.  As this was a part of the total global surgical fee, this was not a billable visit.
